# Patient Record
Sex: Female | Born: 1995
Health system: Southern US, Community
[De-identification: ages and names within clinical notes are randomized; demographics above are authoritative.]

## PROBLEM LIST (undated history)

## (undated) DIAGNOSIS — J399 Disease of upper respiratory tract, unspecified: Secondary | ICD-10-CM

## (undated) DIAGNOSIS — Z6833 Body mass index (BMI) 33.0-33.9, adult: Secondary | ICD-10-CM

## (undated) HISTORY — PX: NO PAST SURGERIES: SHX2092

## (undated) HISTORY — DX: Body mass index (BMI) 33.0-33.9, adult: Z68.33

## (undated) HISTORY — PX: OTHER SURGICAL HISTORY: SHX169

## (undated) HISTORY — DX: Disease of upper respiratory tract, unspecified: J39.9

---
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
## 2016-02-26 ENCOUNTER — Encounter (HOSPITAL_COMMUNITY): Payer: Self-pay | Admitting: Emergency Medicine

## 2016-02-26 DIAGNOSIS — R55 Syncope and collapse: Secondary | ICD-10-CM | POA: Insufficient documentation

## 2016-02-26 LAB — CBG MONITORING, ED: GLUCOSE-CAPILLARY: 118 mg/dL — AB (ref 65–99)

## 2016-02-26 NOTE — ED Notes (Signed)
Pt had a syncopal episode and "blacked out" for 2-3 minutes after swimming 1 hour ago.  States she thinks she swallowed too much water while swimming.  Reports ears felt full and she felt dizziness and nausea.  Pt denies any complaints at present.

## 2016-02-27 ENCOUNTER — Encounter (HOSPITAL_COMMUNITY): Payer: Self-pay

## 2016-02-27 ENCOUNTER — Emergency Department (HOSPITAL_COMMUNITY)
Admission: EM | Admit: 2016-02-27 | Discharge: 2016-02-27 | Disposition: A | Discharge status: Home / Self Care | Payer: BLUE CROSS/BLUE SHIELD | Attending: Emergency Medicine | Admitting: Emergency Medicine

## 2016-02-27 ENCOUNTER — Emergency Department (HOSPITAL_COMMUNITY): Payer: BLUE CROSS/BLUE SHIELD

## 2016-02-27 DIAGNOSIS — R55 Syncope and collapse: Secondary | ICD-10-CM | POA: Diagnosis not present

## 2016-02-27 LAB — CBC
HCT: 35 % — ABNORMAL LOW (ref 36.0–46.0)
Hemoglobin: 12 g/dL (ref 12.0–15.0)
MCH: 28.7 pg (ref 26.0–34.0)
MCHC: 34.3 g/dL (ref 30.0–36.0)
MCV: 83.7 fL (ref 78.0–100.0)
Platelets: 325 10*3/uL (ref 150–400)
RBC: 4.18 MIL/uL (ref 3.87–5.11)
RDW: 13.6 % (ref 11.5–15.5)
WBC: 7.5 10*3/uL (ref 4.0–10.5)

## 2016-02-27 LAB — BASIC METABOLIC PANEL WITH GFR
Anion gap: 9 (ref 5–15)
BUN: 9 mg/dL (ref 6–20)
CO2: 22 mmol/L (ref 22–32)
Calcium: 9.2 mg/dL (ref 8.9–10.3)
Chloride: 107 mmol/L (ref 101–111)
Creatinine, Ser: 0.86 mg/dL (ref 0.44–1.00)
GFR calc Af Amer: >60 mL/min
GFR calc non Af Amer: >60 mL/min
Glucose, Bld: 117 mg/dL — ABNORMAL HIGH (ref 65–99)
Potassium: 3.1 mmol/L — ABNORMAL LOW (ref 3.5–5.1)
Sodium: 138 mmol/L (ref 135–145)

## 2016-02-27 LAB — D-DIMER, QUANTITATIVE: D-Dimer, Quant: 0.54 ug/mL-FEU — ABNORMAL HIGH (ref 0.00–0.50)

## 2016-02-27 LAB — URINALYSIS, ROUTINE W REFLEX MICROSCOPIC
Bilirubin Urine: NEGATIVE
Glucose, UA: NEGATIVE mg/dL
Hgb urine dipstick: NEGATIVE
Ketones, ur: NEGATIVE mg/dL
Leukocytes, UA: NEGATIVE
Nitrite: NEGATIVE
Protein, ur: NEGATIVE mg/dL
Specific Gravity, Urine: 1.013 (ref 1.005–1.030)
pH: 6.5 (ref 5.0–8.0)

## 2016-02-27 LAB — I-STAT BETA HCG BLOOD, ED (MC, WL, AP ONLY): I-stat hCG, quantitative: <5 m[IU]/mL

## 2016-02-27 MED ORDER — IOPAMIDOL (ISOVUE-370) INJECTION 76%
INTRAVENOUS | Status: AC
  Administered 2016-02-27: 100 mL
  Filled 2016-02-27: qty 100

## 2016-02-27 MED ORDER — POTASSIUM CHLORIDE CRYS ER 20 MEQ PO TBCR
40.0000 meq | EXTENDED_RELEASE_TABLET | Freq: Once | ORAL | Status: AC
  Administered 2016-02-27: 40 meq via ORAL
  Filled 2016-02-27: qty 2

## 2016-02-27 MED ORDER — SODIUM CHLORIDE 0.9 % IV BOLUS (SEPSIS)
1000.0000 mL | Freq: Once | INTRAVENOUS | Status: AC
  Administered 2016-02-27: 1000 mL via INTRAVENOUS

## 2016-02-27 NOTE — Discharge Instructions (Signed)
Syncope Lindsay Barnett, your CT scan did not show any blood clots.  Your tests today were normal. Please see a primary care doctor within 3 days for close follow up.  If any symptoms worsen, come back to the ED immediately. Thank you. Syncope means a person passes out (faints). The person usually wakes up in less than 5 minutes. It is important to seek medical care for syncope. HOME CARE  Have someone stay with you until you feel normal.  Do not drive, use machines, or play sports until your doctor says it is okay.  Keep all doctor visits as told.  Lie down when you feel like you might pass out. Take deep breaths. Wait until you feel normal before standing up.  Drink enough fluids to keep your pee (urine) clear or pale yellow.  If you take blood pressure or heart medicine, get up slowly. Take several minutes to sit and then stand. GET HELP RIGHT AWAY IF:   You have a severe headache.  You have pain in the chest, belly (abdomen), or back.  You are bleeding from the mouth or butt (rectum).  You have black or tarry poop (stool).  You have an irregular or very fast heartbeat.  You have pain with breathing.  You keep passing out, or you have shaking (seizures) when you pass out.  You pass out when sitting or lying down.  You feel confused.  You have trouble walking.  You have severe weakness.  You have vision problems. If you fainted, call for help (911 in [place] Do not drive yourself to the hospital.   This information is not intended to replace advice given to you by your health care provider. Make sure you discuss any questions you have with your health care provider.   Document Released: 01/16/2008 Document Revised: 12/14/2014 Document Reviewed: 09/28/2011 Elsevier Interactive Patient Education [employer]2016 Elsevier Inc.

## 2016-02-27 NOTE — ED Provider Notes (Signed)
CSN: [id number]     Arrival date & time 02/26/16  2256 History   First MD Initiated Contact with Patient 02/27/16 [phone]     Chief Complaint  Patient presents with  . Loss of Consciousness     (Consider location/radiation/quality/duration/timing/severity/associated sxs/prior Treatment) HPI  Lindsay Barnett is a 20 y.o. female with no significant past medical history presenting today for syncope. Patient states she has sequela present for 2-3 minutes after swallowing. She thinks she may swallow too much water while swallowing. She states this only happens when she sticks her head under the water and swims. This happened to her once before in the past as well. She states it is helpful and she felt dizziness with nausea. Currently she is completely asymptomatic. Her only new medication is an estrogen-based birth control. She denies any history of blood clots, or lower history swelling. She has no chest pain or shortness of breath. There are no further complaints.  10 Systems reviewed and are negative for acute change except as noted in the HPI.     Past Medical History  Diagnosis Date  . Upper respiratory disease    History reviewed. No pertinent past surgical history. Family History  Problem Relation Age of Onset  . Lung cancer Maternal Grandfather   . Hypertension Mother    Social History  Substance Use Topics  . Smoking status: Never Smoker   . Smokeless tobacco: None  . Alcohol Use: No   OB History    No data available     Review of Systems    Allergies  Review of patient's allergies indicates no known allergies.  Home Medications   Prior to Admission medications   Medication Sig Start Date End Date Taking? Authorizing Provider  norgestimate-ethinyl estradiol (ORTHO-CYCLEN,SPRINTEC,PREVIFEM) 0.25-35 MG-MCG tablet Take 1 tablet by mouth daily. 09/22/15   Doreene Nest, NP   BP 105/49 mmHg  Pulse 73  Temp(Src) 97.8 F (36.6 C) (Oral)  Resp 18  Ht  (1.651  m)  Wt 197 lb (89.359 kg)  BMI 32.78 kg/m2  SpO2 100%  LMP 02/05/2016 Physical Exam  Constitutional: She is oriented to person, place, and time. She appears well-developed and well-nourished. No distress.  HENT:  Head: Normocephalic and atraumatic.  Nose: Nose normal.  Mouth/Throat: Oropharynx is clear and moist. No oropharyngeal exudate.  Eyes: Conjunctivae and EOM are normal. Pupils are equal, round, and reactive to light. No scleral icterus.  Neck: Normal range of motion. Neck supple. No JVD present. No tracheal deviation present. No thyromegaly present.  Cardiovascular: Normal rate, regular rhythm and normal heart sounds.  Exam reveals no gallop and no friction rub.   No murmur heard. Pulmonary/Chest: Effort normal and breath sounds normal. No respiratory distress. She has no wheezes. She exhibits no tenderness.  Abdominal: Soft. Bowel sounds are normal. She exhibits no distension and no mass. There is no tenderness. There is no rebound and no guarding.  Musculoskeletal: Normal range of motion. She exhibits no edema or tenderness.  Lymphadenopathy:    She has no cervical adenopathy.  Neurological: She is alert and oriented to person, place, and time. No cranial nerve deficit. She exhibits normal muscle tone.  Skin: Skin is warm and dry. No rash noted. No erythema. No pallor.  Nursing note and vitals reviewed.   ED Course  Procedures (including critical care time) Labs Review Labs Reviewed  BASIC METABOLIC PANEL - Abnormal; Notable for the following:    Potassium 3.1 (*)  Glucose, Bld 117 (*)    All other components within normal limits  CBC - Abnormal; Notable for the following:    HCT 35.0 (*)    All other components within normal limits  D-DIMER, QUANTITATIVE (NOT AT [facility]) - Abnormal; Notable for the following:    D-Dimer, Quant 0.54 (*)    All other components within normal limits  CBG MONITORING, ED - Abnormal; Notable for the following:    Glucose-Capillary 118 (*)     All other components within normal limits  URINALYSIS, ROUTINE W REFLEX MICROSCOPIC (NOT AT [facility])  I-STAT BETA HCG BLOOD, ED ([facility], [facility], [facility] ONLY)    Imaging Review Ct Angio Chest Pe W Or Wo Contrast  02/27/2016  CLINICAL DATA:  Syncope EXAM: CT ANGIOGRAPHY CHEST WITH CONTRAST TECHNIQUE: Multidetector CT imaging of the chest was performed using the standard protocol during bolus administration of intravenous contrast. Multiplanar CT image reconstructions and MIPs were obtained to evaluate the vascular anatomy. CONTRAST:  100 cc Isovue 370 COMPARISON:  None. FINDINGS: There are no filling defects in the pulmonary arterial tree to suggest acute pulmonary thromboembolism Visualized thyroid is unremarkable Residual thymus.  No abnormal mediastinal adenopathy No pneumothorax.  No pleural effusion. Lungs are under aerated and clear. No acute bony deformity. Review of the MIP images confirms the above findings. IMPRESSION: No evidence of acute pulmonary thromboembolism. Electronically Signed   By: Jolaine ClickArthur  Hoss M.D.   On: 02/27/2016 07:43   I have personally reviewed and evaluated these images and lab results as part of my medical decision-making.   EKG Interpretation   Date/Time:  Sunday February 26 2016 23:31:08 EDT Ventricular Rate:  50 PR Interval:  134 QRS Duration: 82 QT Interval:  438 QTC Calculation: 399 R Axis:   42 Text Interpretation:  Sinus bradycardia Nonspecific ST abnormality  Abnormal ECG No old tracing to compare Confirmed by Erroll Lunani, Jenner Rosier Ayokunle  [phone]([id number]) on 02/27/2016 4:53:29 AM      MDM   Final diagnoses:  None    Patient presents emergency department for syncope. EKG does not show any cause for this. Will obtain pregnancy test as well. D-dimer was sent due to risk factor of birth control. Laboratory studies reveal potassium 3.1 which was replaced. Patient was given IV fluids as well. She currently appears well in no acute distress, currently awaiting dimer results.  5:45 AM  Dimer is positive.  Will obtain CT scan for further evaluation.  CT neg for PE.  Advised to fu c PCP for further syncope work up.  Patient and famil demonstrate good understanding.  She appears well and in NAD.  Avoid swimming fir now.  VS remain within her normal limits and she is safe for DC  Tomasita CrumbleAdeleke Macen Joslin, MD 02/27/16 1349

## 2016-02-27 NOTE — ED Notes (Signed)
Pt to CT

## 2016-02-28 ENCOUNTER — Ambulatory Visit (INDEPENDENT_AMBULATORY_CARE_PROVIDER_SITE_OTHER): Payer: BLUE CROSS/BLUE SHIELD | Admitting: Primary Care

## 2016-02-28 VITALS — BP 114/66 | HR 72 | Temp 98.1°F | Ht 66.0 in | Wt 201.8 lb

## 2016-02-28 DIAGNOSIS — R55 Syncope and collapse: Secondary | ICD-10-CM | POA: Diagnosis not present

## 2016-02-28 DIAGNOSIS — R739 Hyperglycemia, unspecified: Secondary | ICD-10-CM

## 2016-02-28 NOTE — Patient Instructions (Signed)
Complete lab work prior to leaving today. I will notify you of your results once received.   Wear ear plugs when submerging your head underwater.  Meclizine is a medication over the counter used for treatment of Vertigo. You may try this if you experience dizziness/vertigo symptoms in the future.   Please notify me if your symptoms reoccur.  Ensure you are consuming 64 ounces of water daily.  It was a pleasure to see you today!

## 2016-02-28 NOTE — Assessment & Plan Note (Addendum)
Occurred 2 days ago while getting dressed after being in the pool for several hours. No proper hydration. Similar symptoms without syncope occurred 2 months ago after swimming in the pool.  Will have her use earplugs when submerging underwater in the pool, meclizine as needed if dizziness returns. Strict return precautions provided. Patient and family verbalized understanding.  Workup in the ED overall unremarkable including EKG and CT angio which is reassuring. Family history of narcolepsy and her brother.  Given symptoms have been associated just after swimming, suspect syncope and symptoms could be associated with inner ear disorder. Patient experiences changes in pressure once a merged underwater and could also be allowing fluid into her inner ear. This would cause symptoms of dizziness, nausea.  Will recheck BMP to ensure potassium has stabilized. We'll also check blood sugars and TSH to ensure no other abnormality.  May consider neurology consult for further evaluation. All Hospital notes, imaging, labs reviewed.

## 2016-02-28 NOTE — Progress Notes (Signed)
Subjective:    Patient ID: Lindsay Barnett, female    DOB: [birth year], 20 y.o.   MRN: [id number]  HPI  Ms. Lindsay Barnett is a 20 year old female who presents today for emergency department follow up.  She presented to [facility] on 07/17 with a chief complaint of syncope. She was swimming in the pool for numerous hours, without proper hydration. She then stepped out of the pool and started getting dressed when she noticed dizziness and nausea. She experienced a syncope episode that lasted 2-3 minutes. Her family members caught her and layed her on the floor.    She had a prior episode of dizziness, nausea, and vomiting after swimming with her head submerged under water about 2 months ago. She did not experience syncope  during that episode.  She has experienced 2 episodes of flushing and sudden onset of dizziness starting in 5th grade and again in 9th grade which were unrelated to swimming.  She was asymptomatic in the ED. She underwent evaluation with D-Dimer, BMP, UA, and ECG. All labs unremarkable except for positive D-dimer and potassium level of 3.1. She was treated with IV fluids, oral potassium, and underwent CT angio to rule out PE. Her CT was negative for PE. She was then discharged home with recommendations for PCP follow up.  Since her discharge home she denies dizziness, syncope, nausea. She also denies seasonal allergies, ear pressure/pain, congestion.  Review of Systems  Constitutional: Negative for fatigue.  HENT: Negative for congestion and ear pain.   Respiratory: Negative for shortness of breath.   Cardiovascular: Negative for chest pain.  Gastrointestinal: Negative for nausea.  Endocrine: Negative for cold intolerance.  Neurological: Negative for dizziness, seizures, numbness and headaches.       Past Medical History  Diagnosis Date  . Upper respiratory disease      Social History   Social History  . Marital Status: Single    Spouse Name: N/A  . Number of Children:  N/A  . Years of Education: N/A   Occupational History  . Not on file.   Social History Main Topics  . Smoking status: Never Smoker   . Smokeless tobacco: Not on file  . Alcohol Use: No  . Drug Use: No  . Sexual Activity: Not on file   Other Topics Concern  . Not on file   Social History Narrative   Single.   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   Enjoys playing on the computer, cooking.     No past surgical history on file.  Family History  Problem Relation Age of Onset  . Lung cancer Maternal Grandfather   . Hypertension Mother     No Known Allergies  Current Outpatient Prescriptions on File Prior to Visit  Medication Sig Dispense Refill  . norgestimate-ethinyl estradiol (ORTHO-CYCLEN,SPRINTEC,PREVIFEM) 0.25-35 MG-MCG tablet Take 1 tablet by mouth daily. 3 Package 3   No current facility-administered medications on file prior to visit.    BP 114/66 mmHg  Pulse 72  Temp(Src) 98.1 F (36.7 C) (Oral)  Ht  (1.676 m)  Wt 201 lb 12.8 oz (91.536 kg)  BMI 32.59 kg/m2  SpO2 98%  LMP 02/05/2016 (Within Days)    Objective:   Physical Exam  Constitutional: She is oriented to person, place, and time. She appears well-nourished.  Eyes: EOM are normal. Pupils are equal, round, and reactive to light.  Neck: Neck supple. No thyromegaly present.  Cardiovascular: Normal rate and regular rhythm.  Pulmonary/Chest: Effort normal and breath sounds normal.  Neurological: She is alert and oriented to person, place, and time. No cranial nerve deficit.  Skin: Skin is warm and dry.          Assessment & Plan:

## 2016-02-28 NOTE — Progress Notes (Signed)
Pre visit review using our clinic review tool, if applicable. No additional management support is needed unless otherwise documented below in the visit note. 

## 2016-02-29 ENCOUNTER — Ambulatory Visit: Payer: BLUE CROSS/BLUE SHIELD | Admitting: Primary Care

## 2016-02-29 LAB — BASIC METABOLIC PANEL
BUN: 9 mg/dL (ref 6–23)
CALCIUM: 9 mg/dL (ref 8.4–10.5)
CO2: 25 meq/L (ref 19–32)
CREATININE: 0.86 mg/dL (ref 0.40–1.20)
Chloride: 106 mEq/L (ref 96–112)
GFR: 109.11 mL/min (ref >60.00)
GLUCOSE: 101 mg/dL — AB (ref 70–99)
Potassium: 4 mEq/L (ref 3.5–5.1)
Sodium: 138 mEq/L (ref 135–145)

## 2016-02-29 LAB — HEMOGLOBIN A1C: Hgb A1c MFr Bld: 4.8 % (ref 4.6–6.5)

## 2016-02-29 LAB — TSH: TSH: 0.74 u[IU]/mL (ref 0.40–5.00)

## 2016-08-17 ENCOUNTER — Other Ambulatory Visit: Payer: Self-pay | Admitting: Primary Care

## 2016-08-17 DIAGNOSIS — Z30011 Encounter for initial prescription of contraceptive pills: Secondary | ICD-10-CM

## 2016-10-22 ENCOUNTER — Ambulatory Visit (INDEPENDENT_AMBULATORY_CARE_PROVIDER_SITE_OTHER): Payer: BLUE CROSS/BLUE SHIELD | Admitting: Family Medicine

## 2016-10-22 ENCOUNTER — Encounter: Payer: Self-pay | Admitting: Family Medicine

## 2016-10-22 VITALS — BP 110/78 | HR 110 | Temp 98.6°F | Wt 200.4 lb

## 2016-10-22 DIAGNOSIS — J029 Acute pharyngitis, unspecified: Secondary | ICD-10-CM

## 2016-10-22 DIAGNOSIS — J069 Acute upper respiratory infection, unspecified: Secondary | ICD-10-CM

## 2016-10-22 DIAGNOSIS — B9789 Other viral agents as the cause of diseases classified elsewhere: Secondary | ICD-10-CM | POA: Diagnosis not present

## 2016-10-22 NOTE — Progress Notes (Signed)
Pre visit review using our clinic review tool, if applicable. No additional management support is needed unless otherwise documented below in the visit note. 

## 2016-10-22 NOTE — Patient Instructions (Signed)
For nasal congestion you can use Afrin nasal spray for 3 days max, saline nasal spray (generic is fine for all). For cough you can try Delsym. Drink enough fluids to make your urine light yellow. For fever/chill/muscle aches you can take over the counter acetaminophen or ibuprofen- 2 tablets every 8-12 hours.  Please come back in if you are not better in 5-7 days or if you develop wheezing, shortness of breath or persistent vomiting. Your strep test was negative.    Pharyngitis Pharyngitis is redness, pain, and swelling (inflammation) of your pharynx. What are the causes? Pharyngitis is usually caused by infection. Most of the time, these infections are from viruses (viral) and are part of a cold. However, sometimes pharyngitis is caused by bacteria (bacterial). Pharyngitis can also be caused by allergies. Viral pharyngitis may be spread from person to person by coughing, sneezing, and personal items or utensils (cups, forks, spoons, toothbrushes). Bacterial pharyngitis may be spread from person to person by more intimate contact, such as kissing. What are the signs or symptoms? Symptoms of pharyngitis include:  Sore throat.  Tiredness (fatigue).  Low-grade fever.  Headache.  Joint pain and muscle aches.  Skin rashes.  Swollen lymph nodes.  Plaque-like film on throat or tonsils (often seen with bacterial pharyngitis). How is this diagnosed? Your health care provider will ask you questions about your illness and your symptoms. Your medical history, along with a physical exam, is often all that is needed to diagnose pharyngitis. Sometimes, a rapid strep test is done. Other lab tests may also be done, depending on the suspected cause. How is this treated? Viral pharyngitis will usually get better in 3-4 days without the use of medicine. Bacterial pharyngitis is treated with medicines that kill germs (antibiotics). Follow these instructions at home:  Drink enough water and fluids to  keep your urine clear or pale yellow.  Only take over-the-counter or prescription medicines as directed by your health care provider:  If you are prescribed antibiotics, make sure you finish them even if you start to feel better.  Do not take aspirin.  Get lots of rest.  Gargle with 8 oz of salt water ( tsp of salt per 1 qt of water) as often as every 1-2 hours to soothe your throat.  Throat lozenges (if you are not at risk for choking) or sprays may be used to soothe your throat. Contact a health care provider if:  You have large, tender lumps in your neck.  You have a rash.  You cough up green, yellow-brown, or bloody spit. Get help right away if:  Your neck becomes stiff.  You drool or are unable to swallow liquids.  You vomit or are unable to keep medicines or liquids down.  You have severe pain that does not go away with the use of recommended medicines.  You have trouble breathing (not caused by a stuffy nose). This information is not intended to replace advice given to you by your health care provider. Make sure you discuss any questions you have with your health care provider. Document Released: 07/30/2005 Document Revised: 01/05/2016 Document Reviewed: 04/06/2013 Elsevier Interactive Patient Education  2017 [employer]

## 2016-10-22 NOTE — Progress Notes (Signed)
   Subjective:    Patient ID: Lindsay Barnett, female    DOB: 08/16/1995, 21 y.o.   MRN: [id number]  HPI This is a 21 yo female who presents today with 6 days of sore throat, pain with swallowing. No cough, no SOB, no wheeze. Has been taking Allegra D, Nyquil, tylenol, apple cider vinegar, honey, hot tea. Some sinus pressure. Small amount clear nasal drainage. Right ear pain when using ear buds. Has been checking temperature, no fever. No generalized aching. Mother and sister with similar symptoms.    Past Medical History:  Diagnosis Date  . Upper respiratory disease    No past surgical history on file. Family History  Problem Relation Age of Onset  . Lung cancer Maternal Grandfather   . Hypertension Mother    Social History  Substance Use Topics  . Smoking status: Never Smoker  . Smokeless tobacco: Never Used  . Alcohol use No      Review of Systems Per HPI    Objective:   Physical Exam  Constitutional: She appears well-developed and well-nourished. No distress.  HENT:  Head: Normocephalic and atraumatic.  Right Ear: Tympanic membrane, external ear and ear canal normal.  Left Ear: Tympanic membrane, external ear and ear canal normal.  Nose: Nose normal. Right sinus exhibits no maxillary sinus tenderness and no frontal sinus tenderness. Left sinus exhibits no maxillary sinus tenderness and no frontal sinus tenderness.  Mouth/Throat: Uvula is midline and mucous membranes are normal. Posterior oropharyngeal erythema present. No oropharyngeal exudate or posterior oropharyngeal edema.  Skin: She is not diaphoretic.  Vitals reviewed.     BP 110/78 (BP Location: Left Arm, Patient Position: Sitting, Cuff Size: Normal)   Pulse (!) 110   Temp 98.6 F (37 C) (Oral)   Wt 200 lb 6.4 oz (90.9 kg)   LMP 10/17/2016   SpO2 97%   BMI 32.35 kg/m  Wt Readings from Last 3 Encounters:  10/22/16 200 lb 6.4 oz (90.9 kg) (97 %, Z= 1.95)*  02/28/16 201 lb 12.8 oz (91.5 kg) (98 %, Z=  1.98)*  02/26/16 197 lb (89.4 kg) (97 %, Z= 1.91)*   * Growth percentiles are based on CDC 2-20 Years data.      Results for orders placed or performed in visit on 10/22/16  POCT rapid strep A  Result Value Ref Range   Rapid Strep A Screen Negative Negative    Assessment & Plan:  1. Sore throat - POCT rapid strep A- negative - symptoms likely due to #2  2. Viral URI - continue symptomatic treatment, should be feeling better in a couple of days - patient instructions: For nasal congestion you can use Afrin nasal spray for 3 days max, saline nasal spray (generic is fine for all). For cough you can try Delsym. Drink enough fluids to make your urine light yellow. For fever/chill/muscle aches you can take over the counter acetaminophen or ibuprofen- 2 tablets every 8-12 hours.  Please come back in if you are not better in 5-7 days or if you develop wheezing, shortness of breath or persistent vomiting.   Olean Reeeborah Gessner, FNP-BC  [facility] [place], [facility]  10/24/2016 3:22 PM

## 2016-10-24 ENCOUNTER — Encounter: Payer: Self-pay | Admitting: Family Medicine

## 2016-10-24 ENCOUNTER — Ambulatory Visit (INDEPENDENT_AMBULATORY_CARE_PROVIDER_SITE_OTHER): Payer: BLUE CROSS/BLUE SHIELD | Admitting: Family Medicine

## 2016-10-24 VITALS — BP 118/76 | HR 96 | Temp 98.8°F | Wt 197.0 lb

## 2016-10-24 DIAGNOSIS — J0110 Acute frontal sinusitis, unspecified: Secondary | ICD-10-CM

## 2016-10-24 LAB — POCT RAPID STREP A (OFFICE): Rapid Strep A Screen: NEGATIVE

## 2016-10-24 MED ORDER — AMOXICILLIN-POT CLAVULANATE 875-125 MG PO TABS: 1.0000 | ORAL_TABLET | Freq: Two times a day (BID) | ORAL | 0 refills | Status: AC

## 2016-10-24 NOTE — Progress Notes (Signed)
Pre visit review using our clinic review tool, if applicable. No additional management support is needed unless otherwise documented below in the visit note. 

## 2016-10-24 NOTE — Progress Notes (Signed)
SUBJECTIVE:  Lindsay Barnett is a 21 y.o. female who complains of coryza, congestion, sore throat and bilateral sinus pain for 10 days.  She has been taking Allegra D, mucinex and nyquil with only mild improvement of symptoms. She denies a history of anorexia and chest pain and denies a history of asthma. Patient denies smoke cigarettes.   Current Outpatient Prescriptions on File Prior to Visit  Medication Sig Dispense Refill  . SPRINTEC 28 0.25-35 MG-MCG tablet TAKE 1 TABLET BY MOUTH DAILY. 84 tablet 1   No current facility-administered medications on file prior to visit.     No Known Allergies  Past Medical History:  Diagnosis Date  . Upper respiratory disease     No past surgical history on file.  Family History  Problem Relation Age of Onset  . Lung cancer Maternal Grandfather   . Hypertension Mother     Social History   Social History  . Marital status: Single    Spouse name: N/A  . Number of children: N/A  . Years of education: N/A   Occupational History  . Not on file.   Social History Main Topics  . Smoking status: Never Smoker  . Smokeless tobacco: Never Used  . Alcohol use No  . Drug use: No  . Sexual activity: Not on file   Other Topics Concern  . Not on file   Social History Narrative   Single.   Student [facility] [employer]   Studying psychology.   Enjoys playing on the computer, cooking.    The PMH, PSH, Social History, Family History, Medications, and allergies have been reviewed in [facility], and have been updated if relevant.  OBJECTIVE: BP 118/76 (BP Location: Left Arm, Patient Position: Sitting, Cuff Size: Normal)   Pulse 96   Temp 98.8 F (37.1 C) (Oral)   Wt 197 lb (89.4 kg)   LMP 10/17/2016   SpO2 98%   BMI 31.80 kg/m   She appears well, vital signs are as noted. Ears normal.  Throat and pharynx normal.  Neck supple. No adenopathy in the neck. Nose is congested. Sinuses tender. The chest is clear, without wheezes or  rales.  ASSESSMENT:  sinusitis  PLAN: Given duration and progression of symptoms, will treat for bacterial sinusitis with Augmentin.   Symptomatic therapy suggested: push fluids, rest and return office visit prn if symptoms persist or worsen.Call or return to clinic prn if these symptoms worsen or fail to improve as anticipated.

## 2016-10-24 NOTE — Patient Instructions (Signed)
Take antibiotic as directed.  Drink lots of fluids.    Treat sympotmatically with Mucinex, nasal saline irrigation, and Tylenol/Ibuprofen.   Also try an antihistamine/decongestant like claritin D or zyrtec D over the counter- two times a day as needed ( have to sign for them at pharmacy).   Try over the counter nasocort-start with 2 sprays per nostril per day...and then try to taper to 1 spray per nostril once symptoms improve.   You can use warm compresses.    Call if not improving as expected in 5-7 days.    

## 2017-02-01 ENCOUNTER — Other Ambulatory Visit: Payer: Self-pay | Admitting: Primary Care

## 2017-02-01 DIAGNOSIS — Z30011 Encounter for initial prescription of contraceptive pills: Secondary | ICD-10-CM

## 2017-07-23 ENCOUNTER — Other Ambulatory Visit: Payer: Self-pay | Admitting: Primary Care

## 2017-07-23 DIAGNOSIS — Z30011 Encounter for initial prescription of contraceptive pills: Secondary | ICD-10-CM

## 2017-10-15 ENCOUNTER — Ambulatory Visit: Payer: BLUE CROSS/BLUE SHIELD | Admitting: Primary Care

## 2017-10-15 ENCOUNTER — Encounter: Payer: Self-pay | Admitting: Primary Care

## 2017-10-15 DIAGNOSIS — Z30011 Encounter for initial prescription of contraceptive pills: Secondary | ICD-10-CM | POA: Diagnosis not present

## 2017-10-15 DIAGNOSIS — Z3041 Encounter for surveillance of contraceptive pills: Secondary | ICD-10-CM

## 2017-10-15 MED ORDER — NORGESTIMATE-ETH ESTRADIOL 0.25-35 MG-MCG PO TABS: 1.0000 | ORAL_TABLET | Freq: Every day | ORAL | 3 refills | Status: DC

## 2017-10-15 NOTE — Patient Instructions (Signed)
Think about the HPV vaccinations, this will be a series of three vaccinations to help prevent cervical cancer caused by HPV.   You are due for your first Pap smear this year, please schedule to have this done after your 22st birthday.  It was a pleasure to see you today!

## 2017-10-15 NOTE — Progress Notes (Signed)
   Subjective:    Patient ID: Lindsay Barnett, female    DOB: 1996-07-06, 22 y.o.   MRN: [id number]  HPI  Ms. Lindsay Barnett is a 22 year old female who presents today for medication refill.   She is currently managed on Sprintec tablets. She denies missing any pills. She is sexually active with a long term partner and is not using protection. She doesn't recall ever receiving the HPV vaccinations.   She denies vaginal itching, vaginal discharge, heavy vaginal bleeding. Her menstrual cycles are regular, once monthly lasting 5-7 days. She does still experience cramping for the first 1-3 days with improvement after taking Midol PRN.   She's never had a pap smear, will be 22 in May 2019. LMP was February 6th.   Review of Systems  Genitourinary: Negative for dysuria, genital sores, menstrual problem, pelvic pain and vaginal discharge.       Past Medical History:  Diagnosis Date  . Upper respiratory disease      Social History   Socioeconomic History  . Marital status: Single    Spouse name: Not on file  . Number of children: Not on file  . Years of education: Not on file  . Highest education level: Not on file  Social Needs  . Financial resource strain: Not on file  . Food insecurity - worry: Not on file  . Food insecurity - inability: Not on file  . Transportation needs - medical: Not on file  . Transportation needs - non-medical: Not on file  Occupational History  . Not on file  Tobacco Use  . Smoking status: Never Smoker  . Smokeless tobacco: Never Used  Substance and Sexual Activity  . Alcohol use: No    Alcohol/week: 0.0 oz  . Drug use: No  . Sexual activity: Not on file  Other Topics Concern  . Not on file  Social History Narrative   Single.   Student [employer]   Studying psychology.   Enjoys playing on the computer, cooking.     No past surgical history on file.  Family History  Problem Relation Age of Onset  . Lung cancer Maternal Grandfather     . Hypertension Mother     No Known Allergies  Current Outpatient Medications on File Prior to Visit  Medication Sig Dispense Refill  . norgestimate-ethinyl estradiol (SPRINTEC 28) 0.25-35 MG-MCG tablet Take 1 tablet by mouth daily. NEED OFFICE APPOINTMENT FOR ANY MORE REFILLS 84 tablet 0   No current facility-administered medications on file prior to visit.     BP 114/74   Pulse 71   Temp 98.2 F (36.8 C) (Oral)   Ht 5' 5.5" (1.664 m)   Wt 217 lb (98.4 kg)   LMP 09/15/2017   SpO2 99%   BMI 35.56 kg/m    Objective:   Physical Exam  Constitutional: She appears well-nourished.  Neck: Neck supple.  Cardiovascular: Normal rate and regular rhythm.  Pulmonary/Chest: Effort normal and breath sounds normal.  Skin: Skin is warm and dry.          Assessment & Plan:

## 2018-09-14 ENCOUNTER — Other Ambulatory Visit: Payer: Self-pay | Admitting: Primary Care

## 2018-09-14 DIAGNOSIS — Z30011 Encounter for initial prescription of contraceptive pills: Secondary | ICD-10-CM

## 2018-12-02 ENCOUNTER — Other Ambulatory Visit: Payer: Self-pay | Admitting: Primary Care

## 2018-12-02 DIAGNOSIS — Z30011 Encounter for initial prescription of contraceptive pills: Secondary | ICD-10-CM

## 2018-12-24 ENCOUNTER — Other Ambulatory Visit: Payer: Self-pay | Admitting: Primary Care

## 2018-12-24 DIAGNOSIS — Z30011 Encounter for initial prescription of contraceptive pills: Secondary | ICD-10-CM

## 2019-01-08 ENCOUNTER — Ambulatory Visit (INDEPENDENT_AMBULATORY_CARE_PROVIDER_SITE_OTHER): Payer: 59 | Admitting: Primary Care

## 2019-01-08 ENCOUNTER — Encounter: Payer: Self-pay | Admitting: Primary Care

## 2019-01-08 DIAGNOSIS — Z3041 Encounter for surveillance of contraceptive pills: Secondary | ICD-10-CM

## 2019-01-08 MED ORDER — NORETHINDRONE ACET-ETHINYL EST 1-20 MG-MCG PO TABS: 1.0000 | ORAL_TABLET | Freq: Every day | ORAL | 0 refills | Status: DC

## 2019-01-08 NOTE — Progress Notes (Signed)
Subjective:    Patient ID: Lindsay Barnett, female    DOB: Mar 31, 1996, 23 y.o.   MRN: [id number]  HPI  Virtual Visit via Video Note  I connected with Lindsay Barnett on 01/08/19 at  9:20 AM EDT by a video enabled telemedicine application and verified that I am speaking with the correct person using two identifiers.  Location: Patient: Home Provider: Office   I discussed the limitations of evaluation and management by telemedicine and the availability of in person appointments. The patient expressed understanding and agreed to proceed.  History of Present Illness:  Lindsay Barnett is a 23 year old female who presents today for medication refill.  She is currently managed on Sprintec daily for contraception prevention for which she's taken for years. She has never had a pap smear and does plan on scheduling this soon. She does continue with dysmenorrhea, Ibuprofen doesn't help. She denies menorrhagia, breakthrough vaginal bleeding, vaginal discharge. She has menstrual cycle once monthly which will last 4-5 days. LMP 01/08/19.   Observations/Objective:  Alert and oriented. Appears well, not sickly. No distress. Speaking in complete sentences.   Assessment and Plan:  Dysmenorrhea with regular cycle. No menorrhagia. Will trial adjusting the hormones in her OCP's to see if this helps. Stop Sprintec. Start Junel. I strongly advised her to schedule a pap smear with me within the next three months, she verbalized understanding.  She will update.   Follow Up Instructions:  Stop Sprintec 0.25-35 mg-mcg birth control.  Start Junel 1-20 mg-mcg tablets for birth control. Start this on Sunday this week as discussed.   Schedule a pap smear with me within the next three months as discussed.  It was a pleasure to see you today! Mayra Reel, NP-C    I discussed the assessment and treatment plan with the patient. The patient was provided an opportunity to ask questions and all were  answered. The patient agreed with the plan and demonstrated an understanding of the instructions.   The patient was advised to call back or seek an in-person evaluation if the symptoms worsen or if the condition fails to improve as anticipated.    Doreene Nest, NP    Review of Systems  Eyes: Negative for visual disturbance.  Respiratory: Negative for shortness of breath.   Cardiovascular: Negative for chest pain.  Genitourinary: Positive for menstrual problem. Negative for dysuria, frequency and vaginal discharge.       Dysmenorrhea   Neurological: Negative for dizziness and headaches.       Past Medical History:  Diagnosis Date  . Upper respiratory disease      Social History   Socioeconomic History  . Marital status: Single    Spouse name: Not on file  . Number of children: Not on file  . Years of education: Not on file  . Highest education level: Not on file  Occupational History  . Not on file  Social Needs  . Financial resource strain: Not on file  . Food insecurity:    Worry: Not on file    Inability: Not on file  . Transportation needs:    Medical: Not on file    Non-medical: Not on file  Tobacco Use  . Smoking status: Never Smoker  . Smokeless tobacco: Never Used  Substance and Sexual Activity  . Alcohol use: No    Alcohol/week: 0.0 standard drinks  . Drug use: No  . Sexual activity: Not on file  Lifestyle  . Physical activity:  Days per week: Not on file    Minutes per session: Not on file  . Stress: Not on file  Relationships  . Social connections:    Talks on phone: Not on file    Gets together: Not on file    Attends religious service: Not on file    Active member of club or organization: Not on file    Attends meetings of clubs or organizations: Not on file    Relationship status: Not on file  . Intimate partner violence:    Fear of current or ex partner: Not on file    Emotionally abused: Not on file    Physically abused: Not on  file    Forced sexual activity: Not on file  Other Topics Concern  . Not on file  Social History Narrative   Single.   Student [employer]   Studying psychology.   Enjoys playing on the computer, cooking.     No past surgical history on file.  Family History  Problem Relation Age of Onset  . Lung cancer Maternal Grandfather   . Hypertension Mother     No Known Allergies  No current outpatient medications on file prior to visit.   No current facility-administered medications on file prior to visit.     LMP 01/08/2019    Objective:   Physical Exam  Constitutional: She is oriented to person, place, and time. She appears well-nourished.  Respiratory: Effort normal.  Neurological: She is alert and oriented to person, place, and time.  Psychiatric: She has a normal mood and affect.           Assessment & Plan:

## 2019-01-08 NOTE — Patient Instructions (Signed)
Stop Sprintec 0.25-35 mg-mcg birth control.  Start Junel 1-20 mg-mcg tablets for birth control. Start this on Sunday this week as discussed.   Schedule a pap smear with me within the next three months as discussed.  It was a pleasure to see you today! Mayra Reel, NP-C

## 2019-01-08 NOTE — Assessment & Plan Note (Signed)
Dysmenorrhea with regular cycle. No menorrhagia. Will trial adjusting the hormones in her OCP's to see if this helps. Stop Sprintec. Start Junel. I strongly advised her to schedule a pap smear with me within the next three months, she verbalized understanding.  She will update.

## 2019-02-27 ENCOUNTER — Ambulatory Visit: Payer: 59 | Admitting: Primary Care

## 2019-02-27 ENCOUNTER — Encounter: Payer: Self-pay | Admitting: Primary Care

## 2019-02-27 ENCOUNTER — Other Ambulatory Visit: Payer: Self-pay

## 2019-02-27 VITALS — BP 124/82 | HR 88 | Temp 98.4°F | Ht 65.5 in | Wt 216.8 lb

## 2019-02-27 DIAGNOSIS — R2232 Localized swelling, mass and lump, left upper limb: Secondary | ICD-10-CM | POA: Diagnosis not present

## 2019-02-27 NOTE — Patient Instructions (Signed)
You will be contacted regarding your ultrasound of the breast and underarm. Please let us know if you have not been contacted within one week.   Complete monthly self breast exams as discussed.   It was a pleasure to see you today!

## 2019-02-27 NOTE — Assessment & Plan Note (Signed)
Suspect lipoma. Breast exam today with several left breast densities.  Given mothers history of breast cancer, we will obtain left breast ultrasound with axilla evaluation.  Orders placed.

## 2019-02-27 NOTE — Progress Notes (Signed)
Subjective:    Patient ID: Lindsay Barnett, female    DOB: 1995/12/05, 23 y.o.   MRN: [id number]  HPI  Ms.Lindsay Barnett is a 23 year old female who presents today with a chief complaint of skin mass.  Her mass is located to the left axilla for which she first noticed over one year ago. The mass is not painful, no changes in size, no redness. Her mother was diagnosed with breast cancer over one year ago. She does breast exams but is not sure if she's doing them correctly. Denies breast masses/lumps.   Review of Systems  Constitutional: Negative for fever.  Skin: Negative for color change.       Left axillary skin mass       Past Medical History:  Diagnosis Date  . Upper respiratory disease      Social History   Socioeconomic History  . Marital status: Single    Spouse name: Not on file  . Number of children: Not on file  . Years of education: Not on file  . Highest education level: Not on file  Occupational History  . Not on file  Social Needs  . Financial resource strain: Not on file  . Food insecurity    Worry: Not on file    Inability: Not on file  . Transportation needs    Medical: Not on file    Non-medical: Not on file  Tobacco Use  . Smoking status: Never Smoker  . Smokeless tobacco: Never Used  Substance and Sexual Activity  . Alcohol use: No    Alcohol/week: 0.0 standard drinks  . Drug use: No  . Sexual activity: Not on file  Lifestyle  . Physical activity    Days per week: Not on file    Minutes per session: Not on file  . Stress: Not on file  Relationships  . Social Musicianconnections    Talks on phone: Not on file    Gets together: Not on file    Attends religious service: Not on file    Active member of club or organization: Not on file    Attends meetings of clubs or organizations: Not on file    Relationship status: Not on file  . Intimate partner violence    Fear of current or ex partner: Not on file    Emotionally abused: Not on file   Physically abused: Not on file    Forced sexual activity: Not on file  Other Topics Concern  . Not on file  Social History Narrative   Single.   Student [employer]   Studying psychology.   Enjoys playing on the computer, cooking.     History reviewed. No pertinent surgical history.  Family History  Problem Relation Age of Onset  . Lung cancer Maternal Grandfather   . Hypertension Mother   . Breast cancer Mother [id number]    No Known Allergies  Current Outpatient Medications on File Prior to Visit  Medication Sig Dispense Refill  . norethindrone-ethinyl estradiol (JUNEL 1/20) 1-20 MG-MCG tablet Take 1 tablet by mouth daily. 3 Package 0   No current facility-administered medications on file prior to visit.     BP 124/82   Pulse 88   Temp 98.4 F (36.9 C) (Temporal)   Ht 5' 5.5" (1.664 m)   Wt 216 lb 12 oz (98.3 kg)   LMP 02/25/2019   SpO2 99%   BMI 35.52 kg/m    Objective:   Physical Exam  Constitutional:  She appears well-nourished.  Neck: Neck supple.  Cardiovascular: Normal rate and regular rhythm.  Respiratory: Effort normal and breath sounds normal.    Several densities noted to the left breast, mostly noted to 5 and 7 o'clock regions.   Skin: Skin is warm and dry. No erythema.           Assessment & Plan:

## 2019-03-11 ENCOUNTER — Other Ambulatory Visit: Payer: Self-pay

## 2019-03-11 ENCOUNTER — Ambulatory Visit
Admission: RE | Admit: 2019-03-11 | Discharge: 2019-03-11 | Disposition: A | Discharge status: Home / Self Care | Payer: 59 | Source: Ambulatory Visit | Attending: Primary Care | Admitting: Primary Care

## 2019-03-11 DIAGNOSIS — R2232 Localized swelling, mass and lump, left upper limb: Secondary | ICD-10-CM

## 2019-03-26 ENCOUNTER — Telehealth: Payer: Self-pay | Admitting: *Deleted

## 2019-03-26 NOTE — Telephone Encounter (Signed)
Pt called triage asking to speak with Vallarie Mare, I did advised pt that Vallarie Mare is gone for today. Pt let me know that Vallarie Mare tried to help her with mychart and sent her a link. She said she had some questions about the link and wanted to talk to [place] about it. I did give her the mychart phone # and advised her that they should be able to answer any questions she had with getting set up with mychart but she still requested I send the message to Vallarie Mare and request her to call her back once she returns to the office    CB# [phone]

## 2019-03-29 ENCOUNTER — Other Ambulatory Visit: Payer: Self-pay | Admitting: Primary Care

## 2019-03-29 DIAGNOSIS — Z3041 Encounter for surveillance of contraceptive pills: Secondary | ICD-10-CM

## 2019-03-30 NOTE — Telephone Encounter (Signed)
Spoken to patient and resent the code for patient to activate MyChart.

## 2019-11-02 ENCOUNTER — Other Ambulatory Visit: Payer: Self-pay

## 2019-11-02 ENCOUNTER — Ambulatory Visit: Payer: 59 | Admitting: Primary Care

## 2019-11-02 ENCOUNTER — Other Ambulatory Visit (HOSPITAL_COMMUNITY)
Admission: RE | Admit: 2019-11-02 | Discharge: 2019-11-02 | Disposition: A | Discharge status: Home / Self Care | Payer: 59 | Source: Ambulatory Visit | Attending: Primary Care | Admitting: Primary Care

## 2019-11-02 ENCOUNTER — Encounter: Payer: Self-pay | Admitting: Primary Care

## 2019-11-02 VITALS — BP 118/72 | HR 102 | Temp 97.2°F | Wt 238.8 lb

## 2019-11-02 DIAGNOSIS — Z3041 Encounter for surveillance of contraceptive pills: Secondary | ICD-10-CM

## 2019-11-02 DIAGNOSIS — Z124 Encounter for screening for malignant neoplasm of cervix: Secondary | ICD-10-CM

## 2019-11-02 DIAGNOSIS — F329 Major depressive disorder, single episode, unspecified: Secondary | ICD-10-CM | POA: Diagnosis not present

## 2019-11-02 DIAGNOSIS — F32A Depression, unspecified: Secondary | ICD-10-CM

## 2019-11-02 MED ORDER — NORETHINDRONE ACET-ETHINYL EST 1-20 MG-MCG PO TABS: 1.0000 | ORAL_TABLET | Freq: Every day | ORAL | 3 refills | Status: DC

## 2019-11-02 NOTE — Progress Notes (Signed)
Subjective:    Patient ID: Lindsay Barnett, female    DOB: 07-09-1996, 24 y.o.   MRN: [id number]  HPI  This visit occurred during the SARS-CoV-2 public health emergency.  Safety protocols were in place, including screening questions prior to the visit, additional usage of staff PPE, and extensive cleaning of exam room while observing appropriate contact time as indicated for disinfecting solutions.   Lindsay Barnett is a 24 year old female who presents today for medication refills and pap smear.  Currently managed on Junel 1/20 tablets daily. Menstrual cycles occur once monthly lasting 4-5 days, light overall. She denies dysmenorrhea. She's never had a pap smear.   She denies vaginal discharge, dysuria. Sexually active, not using condoms for STD prevention.   She would also like a referral to speak with a therapist. Symptoms include little motivation to do things, feeling like she's letting herself down, fatigue, feeling down about her weight. She's been exercising 10 minutes daily over the last several weeks which has helped. PHQ 9 score of 15.   Review of Systems  Constitutional: Negative for fatigue.  Cardiovascular: Negative for palpitations.  Genitourinary: Negative for dysuria, frequency, menstrual problem, pelvic pain and vaginal discharge.       Past Medical History:  Diagnosis Date  . Upper respiratory disease      Social History   Socioeconomic History  . Marital status: Single    Spouse name: Not on file  . Number of children: Not on file  . Years of education: Not on file  . Highest education level: Not on file  Occupational History  . Not on file  Tobacco Use  . Smoking status: Never Smoker  . Smokeless tobacco: Never Used  Substance and Sexual Activity  . Alcohol use: No    Alcohol/week: 0.0 standard drinks  . Drug use: No  . Sexual activity: Not on file  Other Topics Concern  . Not on file  Social History Narrative   Single.   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   Enjoys playing on the computer, cooking.    Social Determinants of Health   Financial Resource Strain:   . Difficulty of Paying Living Expenses:   Food Insecurity:   . Worried About Programme researcher, broadcasting/film/video in the Last Year:   . Barista in the Last Year:   Transportation Needs:   . Freight forwarder (Medical):   Marland Kitchen Lack of Transportation (Non-Medical):   Physical Activity:   . Days of Exercise per Week:   . Minutes of Exercise per Session:   Stress:   . Feeling of Stress :   Social Connections:   . Frequency of Communication with Friends and Family:   . Frequency of Social Gatherings with Friends and Family:   . Attends Religious Services:   . Active Member of Clubs or Organizations:   . Attends Banker Meetings:   Marland Kitchen Marital Status:   Intimate Partner Violence:   . Fear of Current or Ex-Partner:   . Emotionally Abused:   Marland Kitchen Physically Abused:   . Sexually Abused:     No past surgical history on file.  Family History  Problem Relation Age of Onset  . Lung cancer Maternal Grandfather   . Hypertension Mother   . Breast cancer Mother 29  . Breast cancer Maternal Aunt        60's    No Known Allergies  Current Outpatient Medications on File  Prior to Visit  Medication Sig Dispense Refill  . JUNEL 1/20 1-20 MG-MCG tablet TAKE 1 TABLET BY MOUTH EVERY DAY 84 tablet 1   No current facility-administered medications on file prior to visit.    BP 118/72 (BP Location: Left Arm, Patient Position: Sitting, Cuff Size: Normal)   Pulse (!) 102   Temp (!) 97.2 F (36.2 C) (Temporal)   Wt 238 lb 12.8 oz (108.3 kg)   LMP 10/12/2019   SpO2 98%   BMI 39.13 kg/m    Objective:   Physical Exam  Constitutional: She appears well-nourished.  Cardiovascular: Normal rate and regular rhythm.  Respiratory: Effort normal and breath sounds normal.  Genitourinary: Cervix exhibits no motion tenderness and no discharge. Right adnexum  displays no tenderness. Left adnexum displays no tenderness.    No vaginal discharge or erythema.  No erythema in the vagina.  Musculoskeletal:     Cervical back: Neck supple.  Skin: Skin is warm and dry.  Psychiatric: She has a normal mood and affect.           Assessment & Plan:

## 2019-11-02 NOTE — Patient Instructions (Addendum)
We will be in touch with your pap results once received.  You will be contacted regarding your referral to therapy.  Please let us know if you have not been contacted within two weeks.   It was a pleasure to see you today!

## 2019-11-02 NOTE — Assessment & Plan Note (Signed)
Doing well on OCP's. Discussed condoms to prevent STD's. Pap smear completed today.  Exam today benign.

## 2019-11-04 LAB — CYTOLOGY - PAP
Comment: NEGATIVE
Diagnosis: NEGATIVE
High risk HPV: NEGATIVE

## 2019-11-24 ENCOUNTER — Ambulatory Visit (INDEPENDENT_AMBULATORY_CARE_PROVIDER_SITE_OTHER): Payer: 59 | Admitting: Psychologist

## 2019-11-24 DIAGNOSIS — F320 Major depressive disorder, single episode, mild: Secondary | ICD-10-CM

## 2019-12-10 ENCOUNTER — Ambulatory Visit (INDEPENDENT_AMBULATORY_CARE_PROVIDER_SITE_OTHER): Payer: 59 | Admitting: Psychologist

## 2019-12-10 DIAGNOSIS — F320 Major depressive disorder, single episode, mild: Secondary | ICD-10-CM

## 2020-01-01 ENCOUNTER — Ambulatory Visit: Payer: 59 | Admitting: Psychologist

## 2020-08-15 DIAGNOSIS — Z20828 Contact with and (suspected) exposure to other viral communicable diseases: Secondary | ICD-10-CM | POA: Diagnosis not present

## 2020-08-24 ENCOUNTER — Other Ambulatory Visit: Payer: Self-pay

## 2020-08-24 ENCOUNTER — Other Ambulatory Visit: Payer: BC Managed Care – PPO

## 2020-08-24 ENCOUNTER — Telehealth (INDEPENDENT_AMBULATORY_CARE_PROVIDER_SITE_OTHER): Payer: Self-pay | Admitting: Primary Care

## 2020-08-24 ENCOUNTER — Telehealth: Payer: Self-pay

## 2020-08-24 DIAGNOSIS — R0982 Postnasal drip: Secondary | ICD-10-CM | POA: Diagnosis not present

## 2020-08-24 NOTE — Telephone Encounter (Signed)
Please advise when we can make app

## 2020-08-24 NOTE — Assessment & Plan Note (Signed)
Acute viral symptoms since 08/12/20, negative test result on 08/22/20. Has been vaccinated. Given her mild symptoms and the fact that her boyfriend tested positive, we will swab her again today.  According to the CDC she's able to come out of isolation with a mask.   Discussed conservative treatment.  Await results.

## 2020-08-24 NOTE — Progress Notes (Signed)
Subjective:    Patient ID: Lindsay Barnett, female    DOB: 20-Jan-1996, 25 y.o.   MRN: [id number]  HPI  Virtual Visit via Video Note  I connected with Lindsay Barnett on 08/24/20 at 10:40 AM EST by a video enabled telemedicine application and verified that I am speaking with the correct person using two identifiers.  We attempted to connect but she could not get her camera enabled. We conducted our visit via phone which lasted 9 min 45 sec.  Location: Patient: Home Provider: Office Participants: Patient and myself   I discussed the limitations of evaluation and management by telemedicine and the availability of in person appointments. The patient expressed understanding and agreed to proceed.  History of Present Illness:  Lindsay Barnett is a 25 year old female who presents today with a chief complaint of rhinorrhea.  She also reports post nasal drip and mild cough.   Symptoms began 08/12/20 with sore throat, headache. She then began to experience rhinorrhea, post nasal drip so she tested for Covid-19 on 08/16/20, results returned 08/22/20 and were negative. Her boyfriend received a positive result, he has been back at work.  She had to file a claim to return to work but was told by HR that she cannot return until 08/29/20. She has had two Covid-19 vaccines.    Observations/Objective:  Alert and oriented. Sounds mildly congested. No distress. Speaking in complete sentences. No cough during visit.  Assessment and Plan:  Acute viral symptoms since 08/12/20, negative test result on 08/22/20. Has been vaccinated. Given her mild symptoms and the fact that her boyfriend tested positive, we will swab her again today.  According to the CDC she's able to come out of isolation with a mask.   Discussed conservative treatment.  Await results.  Follow Up Instructions:  Come by the office today as scheduled.  Be sure to wear a mask anywhere you go or when around others.  We  will be in touch with results once received.   It was a pleasure to see you today! Mayra Reel, NP-C C   I discussed the assessment and treatment plan with the patient. The patient was provided an opportunity to ask questions and all were answered. The patient agreed with the plan and demonstrated an understanding of the instructions.   The patient was advised to call back or seek an in-person evaluation if the symptoms worsen or if the condition fails to improve as anticipated.    Doreene Nest, NP   Review of Systems  Constitutional: Negative for chills, fatigue and fever.  HENT: Positive for congestion, postnasal drip and rhinorrhea.   Respiratory: Positive for cough.   Gastrointestinal: Negative for diarrhea.  Allergic/Immunologic: Negative for environmental allergies.       Past Medical History:  Diagnosis Date  . Upper respiratory disease      Social History   Socioeconomic History  . Marital status: Single    Spouse name: Not on file  . Number of children: Not on file  . Years of education: Not on file  . Highest education level: Not on file  Occupational History  . Not on file  Tobacco Use  . Smoking status: Never Smoker  . Smokeless tobacco: Never Used  Substance and Sexual Activity  . Alcohol use: No    Alcohol/week: 0.0 standard drinks  . Drug use: No  . Sexual activity: Not on file  Other Topics Concern  . Not on file  Social History  Narrative   Single.   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   Enjoys playing on the computer, cooking.    Social Determinants of Health   Financial Resource Strain: Not on file  Food Insecurity: Not on file  Transportation Needs: Not on file  Physical Activity: Not on file  Stress: Not on file  Social Connections: Not on file  Intimate Partner Violence: Not on file    No past surgical history on file.  Family History  Problem Relation Age of Onset  . Lung cancer Maternal Grandfather   .  Hypertension Mother   . Breast cancer Mother 32  . Breast cancer Maternal Aunt        60's    No Known Allergies  Current Outpatient Medications on File Prior to Visit  Medication Sig Dispense Refill  . norethindrone-ethinyl estradiol (JUNEL 1/20) 1-20 MG-MCG tablet Take 1 tablet by mouth daily. 84 tablet 3   No current facility-administered medications on file prior to visit.    Ht 5' 5.5" (1.664 m)   Wt 238 lb (108 kg)   BMI 39.00 kg/m    Objective:   Physical Exam Constitutional:      General: She is not in acute distress.    Appearance: She is not ill-appearing.  Pulmonary:     Effort: Pulmonary effort is normal.  Neurological:     Mental Status: She is alert and oriented to person, place, and time.  Psychiatric:        Mood and Affect: Mood normal.            Assessment & Plan:

## 2020-08-24 NOTE — Telephone Encounter (Signed)
No, I instructed her to schedule an appointment for next week, this way we have time to ensure she has a negative test.    Technically, her symptoms started on 08/12/2020, so she is past the 10-day onset of symptoms, plus a negative COVID test. We will see what this one says, I still recommend next week as discussed with her via phone.

## 2020-08-24 NOTE — Patient Instructions (Signed)
Come by the office today as scheduled.  Be sure to wear a mask anywhere you go or when around others.  We will be in touch with results once received.   It was a pleasure to see you today! Mayra Reel, NP-C

## 2020-08-24 NOTE — Telephone Encounter (Signed)
Pt called to schedule appointment as soon as possible for in person visit. She said that Jae Dire told her it was ok to do. I saw patient was having a covid test today and let her know we need to wait on covid test before bringing you into office. Pt said well Jae Dire told me I was ok since this has been going on for a while. To be on the safe side I am asking when is this patient is allowed to come to office for in person appointment for place under arm and check her back?

## 2020-08-25 NOTE — Telephone Encounter (Signed)
Called appointment made for next week pending neg covid test.

## 2020-08-28 LAB — NOVEL CORONAVIRUS, NAA: SARS-CoV-2, NAA: DETECTED — AB

## 2020-08-30 NOTE — Telephone Encounter (Signed)
Pt called to report she just received a call reporting her covid test results but needs a copy and forgot to ask. Advised a copy would be put at the front desk. Pt verbalized understanding.

## 2020-08-31 ENCOUNTER — Ambulatory Visit: Payer: BC Managed Care – PPO | Admitting: Primary Care

## 2020-09-07 DIAGNOSIS — S93421A Sprain of deltoid ligament of right ankle, initial encounter: Secondary | ICD-10-CM | POA: Diagnosis not present

## 2020-09-19 ENCOUNTER — Ambulatory Visit: Payer: BC Managed Care – PPO | Admitting: Primary Care

## 2020-09-19 ENCOUNTER — Other Ambulatory Visit: Payer: Self-pay

## 2020-09-19 VITALS — BP 124/62 | HR 83 | Temp 97.8°F | Ht 65.5 in | Wt 218.0 lb

## 2020-09-19 DIAGNOSIS — R2231 Localized swelling, mass and lump, right upper limb: Secondary | ICD-10-CM | POA: Diagnosis not present

## 2020-09-19 NOTE — Progress Notes (Signed)
Subjective:    Patient ID: Lindsay Barnett, female    DOB: 12-31-1995, 25 y.o.   MRN: [id number]  HPI  This visit occurred during the SARS-CoV-2 public health emergency.  Safety protocols were in place, including screening questions prior to the visit, additional usage of staff PPE, and extensive cleaning of exam room while observing appropriate contact time as indicated for disinfecting solutions.   Lindsay Barnett is a 25 year old female with a history of left axillary mass who presents today with a chief complaint of axillary mass.  Her mass is located under the right axilla for which she first noticed 2-3 weeks ago. She describes the mass as immobile, pea size, tender.Family history of breast cancer in her mother who was diagnosed at the age of 69.  She cannot always find the mass on palpation, but her mother has found it on several occasions. She denies drainage, erythema, skin texture changes.   Review of Systems  Skin: Negative for color change.       Right axillary mass       Past Medical History:  Diagnosis Date  . Upper respiratory disease      Social History   Socioeconomic History  . Marital status: Single    Spouse name: Not on file  . Number of children: Not on file  . Years of education: Not on file  . Highest education level: Not on file  Occupational History  . Not on file  Tobacco Use  . Smoking status: Never Smoker  . Smokeless tobacco: Never Used  Substance and Sexual Activity  . Alcohol use: No    Alcohol/week: 0.0 standard drinks  . Drug use: No  . Sexual activity: Not on file  Other Topics Concern  . Not on file  Social History Narrative   Single.   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   Enjoys playing on the computer, cooking.    Social Determinants of Health   Financial Resource Strain: Not on file  Food Insecurity: Not on file  Transportation Needs: Not on file  Physical Activity: Not on file  Stress: Not on file   Social Connections: Not on file  Intimate Partner Violence: Not on file    No past surgical history on file.  Family History  Problem Relation Age of Onset  . Lung cancer Maternal Grandfather   . Hypertension Mother   . Breast cancer Mother 47  . Breast cancer Maternal Aunt        60's    No Known Allergies  Current Outpatient Medications on File Prior to Visit  Medication Sig Dispense Refill  . norethindrone-ethinyl estradiol (JUNEL 1/20) 1-20 MG-MCG tablet Take 1 tablet by mouth daily. 84 tablet 3   No current facility-administered medications on file prior to visit.    BP 124/62   Pulse 83   Temp 97.8 F (36.6 C) (Temporal)   Ht 5' 5.5" (1.664 m)   Wt 218 lb (98.9 kg)   SpO2 99%   BMI 35.73 kg/m    Objective:   Physical Exam Cardiovascular:     Rate and Rhythm: Normal rate.  Pulmonary:     Effort: Pulmonary effort is normal.  Chest:     Comments: No axillar mass noted to either axilla.  Skin appears normal. No texture abnormalities, breakdown, erythema.  Neurological:     Mental Status: She is alert.            Assessment & Plan:

## 2020-09-19 NOTE — Patient Instructions (Signed)
You will be contacted regarding your breast and axillary ultrasound.  Please let us know if you have not been contacted within two weeks.   It was a pleasure to see you today!

## 2020-09-19 NOTE — Assessment & Plan Note (Signed)
Acutely noted by patient and mother 2-3 weeks ago. No masses or abnormalities noted to either axilla today.  Given FH of breast cancer in mother will obtain US of right breast and axilla.   Await results.

## 2020-09-26 ENCOUNTER — Other Ambulatory Visit: Payer: BC Managed Care – PPO

## 2020-10-28 ENCOUNTER — Other Ambulatory Visit: Payer: Self-pay

## 2020-10-28 ENCOUNTER — Ambulatory Visit
Admission: RE | Admit: 2020-10-28 | Discharge: 2020-10-28 | Disposition: A | Discharge status: Home / Self Care | Payer: BC Managed Care – PPO | Source: Ambulatory Visit | Attending: Primary Care | Admitting: Primary Care

## 2020-10-28 DIAGNOSIS — R2231 Localized swelling, mass and lump, right upper limb: Secondary | ICD-10-CM

## 2020-12-07 ENCOUNTER — Other Ambulatory Visit: Payer: Self-pay | Admitting: Primary Care

## 2020-12-07 DIAGNOSIS — Z3041 Encounter for surveillance of contraceptive pills: Secondary | ICD-10-CM

## 2020-12-07 NOTE — Telephone Encounter (Signed)
Patient is due for CPE/follow up, this will be required prior to any further refills.  Please schedule.   

## 2021-01-05 NOTE — Telephone Encounter (Signed)
lvmtcb to schedule CPE 

## 2021-01-10 ENCOUNTER — Other Ambulatory Visit: Payer: Self-pay | Admitting: Primary Care

## 2021-01-10 DIAGNOSIS — Z3041 Encounter for surveillance of contraceptive pills: Secondary | ICD-10-CM

## 2021-01-10 NOTE — Telephone Encounter (Signed)
Patient has not been seen for general follow-up/birth control follow-up since March 2021.  Needs CPE for further refills, please schedule.

## 2021-01-11 ENCOUNTER — Telehealth: Payer: Self-pay | Admitting: Primary Care

## 2021-01-11 NOTE — Telephone Encounter (Signed)
Left message to return call to our office.  

## 2021-01-11 NOTE — Telephone Encounter (Signed)
Pt calling back to speak with Nurse

## 2021-01-12 NOTE — Telephone Encounter (Signed)
Called patient app made for CPE informed must keep to get next refills.

## 2021-01-12 NOTE — Telephone Encounter (Signed)
See refill request called patient appointment made.

## 2021-01-24 NOTE — Telephone Encounter (Signed)
Patient is scheduled   

## 2021-01-29 IMAGING — US ULTRASOUND LEFT BREAST LIMITED
1 series · 7 of 7 positions shown · non-contrast
Comparison: None

CLINICAL DATA: 23-year-old female with palpable thickening in the
LOWER OUTER LEFT breast discovered on clinical examination and
palpable fullness in the LEFT axilla identified on self-examination.

EXAM:
ULTRASOUND OF THE LEFT BREAST

[Series 1: ultrasound left breast limited · 0.08mm/px · 7 of 7 slices shown]
[im 1/7]
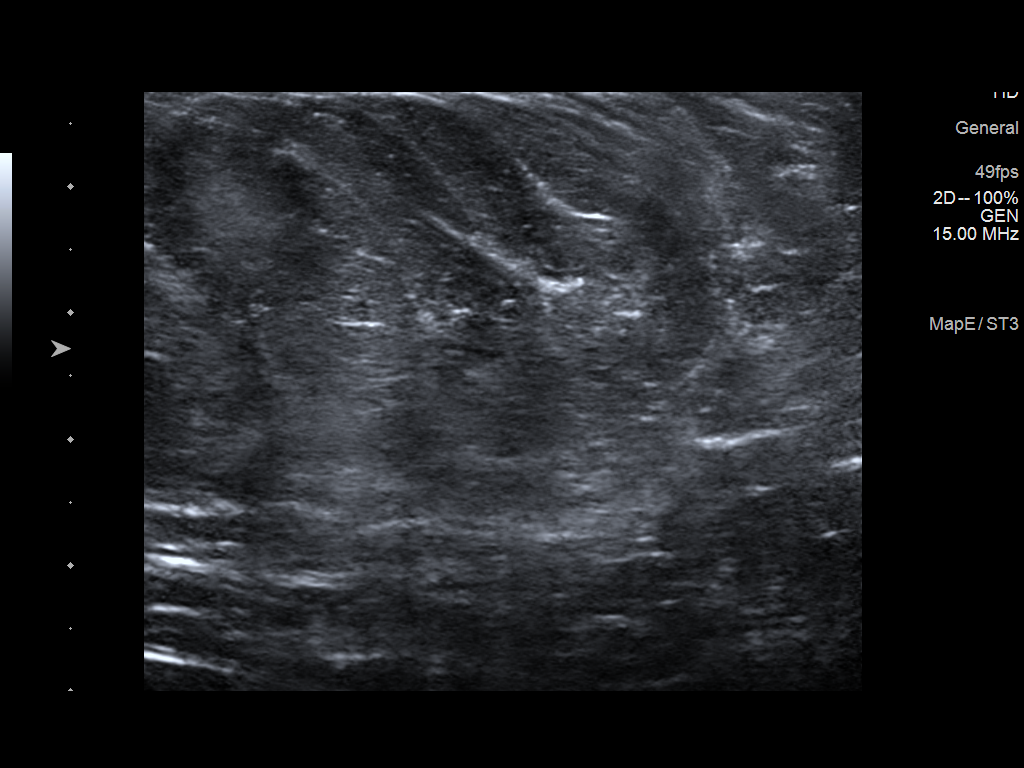
[im 2/7]
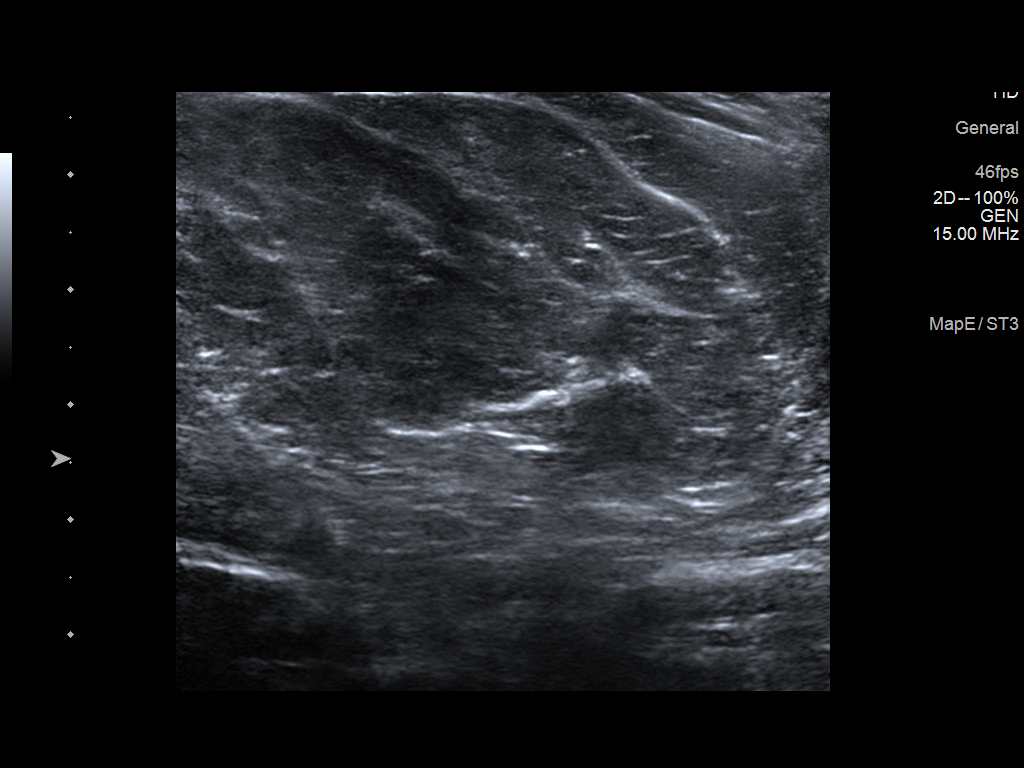
[im 3/7]
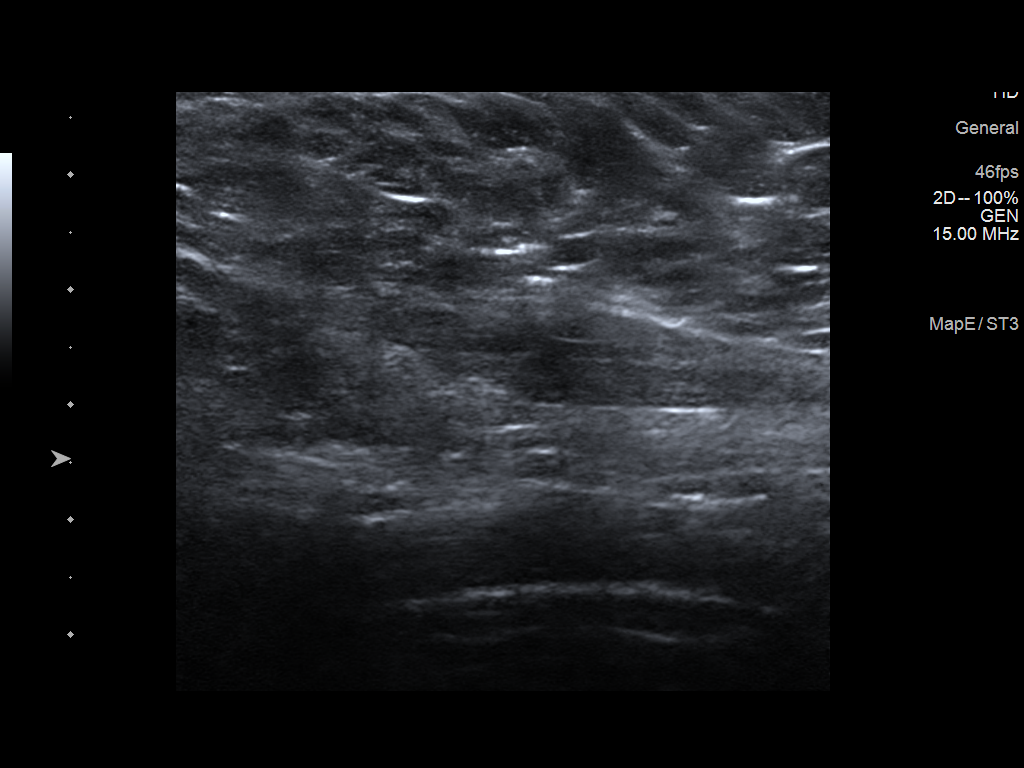
[im 4/7]
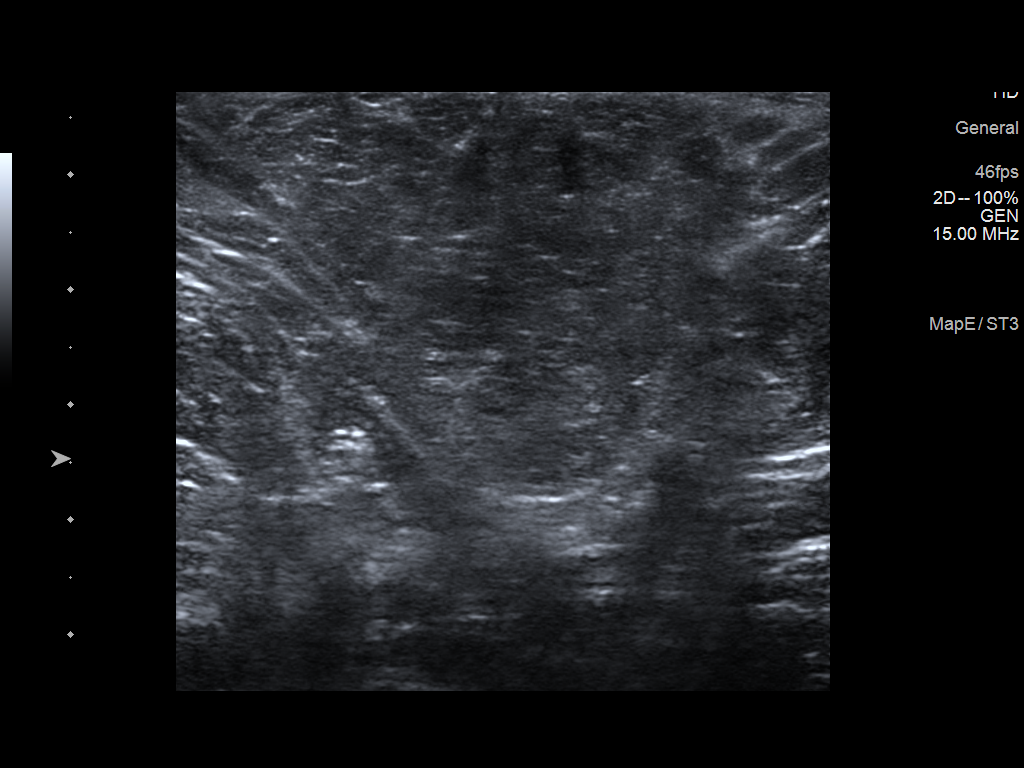
[im 5/7]
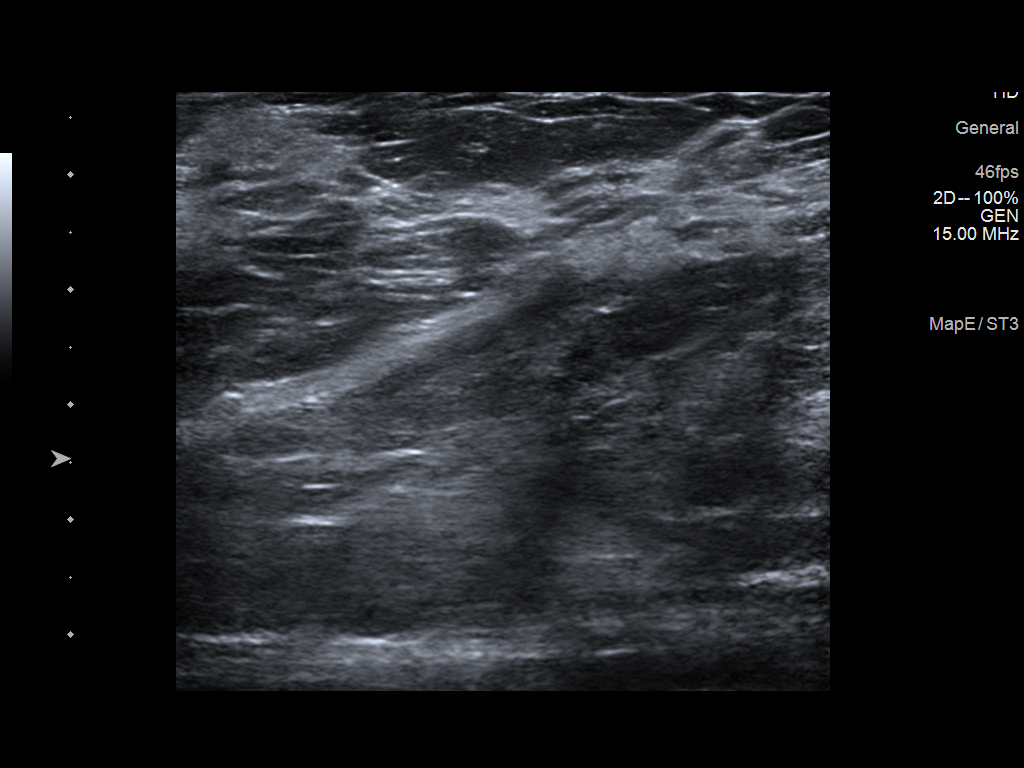
[im 6/7]
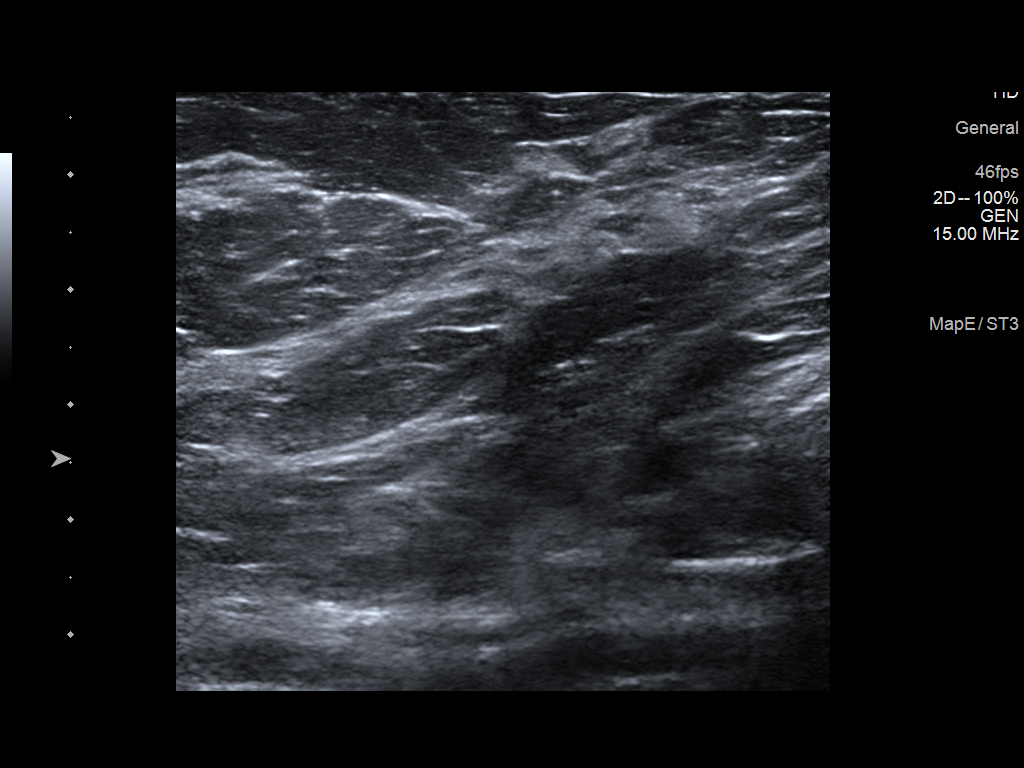
[im 7/7]
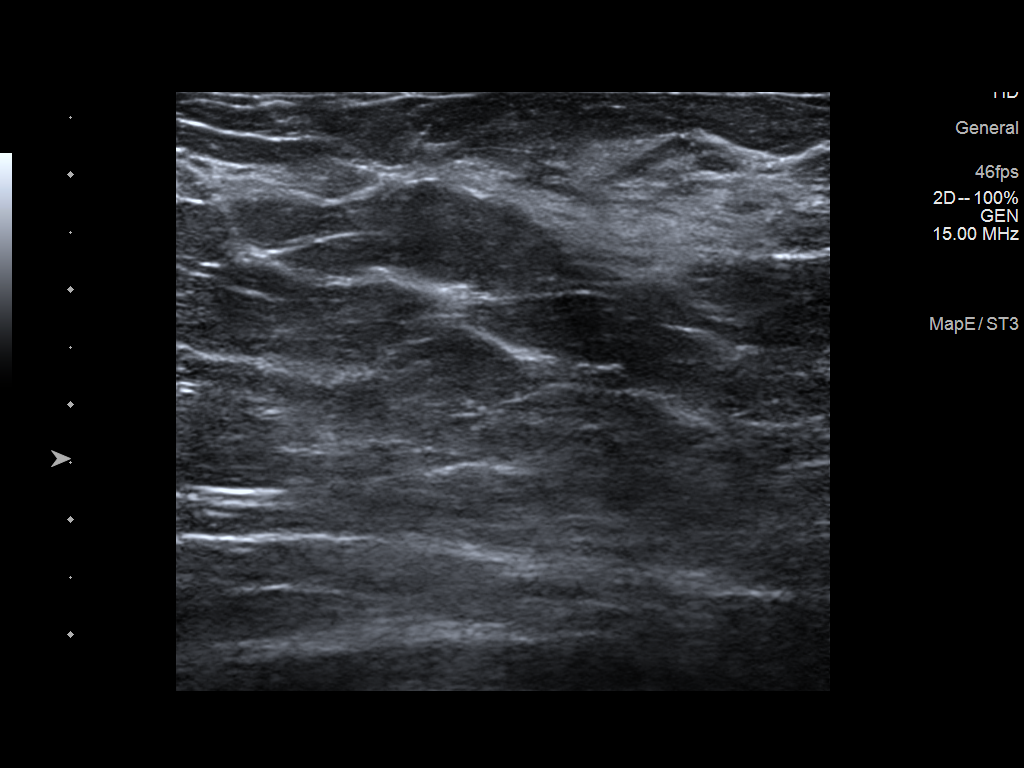

[7 of 7 positions shown; findings below may reference images not displayed]

FINDINGS: On physical exam, minimal thickening in the LOWER OUTER LEFT breast
identified. Soft fullness in the LEFT axilla is identified.

Targeted ultrasound is performed, showing no solid or cystic mass,
distortion or abnormal shadowing within the LOWER OUTER LEFT breast
or LEFT axilla. No abnormal LEFT axillary lymph nodes are
identified.
IMPRESSION: No suspicious palpable or sonographic abnormalities in the areas of
palpable concern.

RECOMMENDATION:
Bilateral screening mammogram at age 41.

Clinical follow-up recommended for the area of concern in the LEFT
breast. Any further workup should be based on clinical grounds.

I have discussed the findings and recommendations with the patient.
If applicable, a reminder letter will be sent to the patient
regarding the next appointment.

BI-RADS CATEGORY  1: Negative.

## 2021-01-31 ENCOUNTER — Encounter: Payer: Self-pay | Admitting: Primary Care

## 2021-01-31 ENCOUNTER — Ambulatory Visit: Payer: BC Managed Care – PPO | Admitting: Primary Care

## 2021-01-31 ENCOUNTER — Other Ambulatory Visit: Payer: Self-pay

## 2021-01-31 VITALS — BP 126/72 | HR 100 | Temp 98.3°F | Ht 66.0 in | Wt 220.0 lb

## 2021-01-31 DIAGNOSIS — Z0000 Encounter for general adult medical examination without abnormal findings: Secondary | ICD-10-CM

## 2021-01-31 DIAGNOSIS — Z23 Encounter for immunization: Secondary | ICD-10-CM

## 2021-01-31 DIAGNOSIS — Z0001 Encounter for general adult medical examination with abnormal findings: Secondary | ICD-10-CM | POA: Insufficient documentation

## 2021-01-31 DIAGNOSIS — Z3041 Encounter for surveillance of contraceptive pills: Secondary | ICD-10-CM

## 2021-01-31 DIAGNOSIS — R21 Rash and other nonspecific skin eruption: Secondary | ICD-10-CM

## 2021-01-31 MED ORDER — NORETHINDRONE ACET-ETHINYL EST 1-20 MG-MCG PO TABS: 1.0000 | ORAL_TABLET | Freq: Every day | ORAL | 3 refills | Status: DC

## 2021-01-31 NOTE — Assessment & Plan Note (Signed)
Likely fungal/yeast from sweating under breasts. Discussed OTC treatment and to keep area dry.  She will update if no improvement.

## 2021-01-31 NOTE — Progress Notes (Signed)
Subjective:    Patient ID: Lindsay Barnett, female    DOB: 02-03-1996, 25 y.o.   MRN: [id number]  HPI  Lindsay Barnett is a very pleasant 25 y.o. female who presents today for complete physical.  She would also like mention an intermittent rash, occurs under breasts intermittently. Not itchy, will burn. Sometimes with a foul smell. She's not tried anything OTC.   Immunizations: -Tetanus: Tetanus unsure.  -Influenza: Did not receive last year -Covid-19: 2 vaccines   -HPV: Never completed   Diet: Fair diet. She is considering Optavia.  Exercise: No regular exercise.  Eye exam: Completes annually  Dental exam: Completes semi-annually   Pap Smear: Completed in 2021  BP Readings from Last 3 Encounters:  01/31/21 126/72  09/19/20 124/62  11/02/19 118/72        Review of Systems  Constitutional:  Negative for unexpected weight change.  HENT:  Negative for rhinorrhea.   Eyes:  Negative for visual disturbance.  Respiratory:  Negative for cough and shortness of breath.   Cardiovascular:  Negative for chest pain.  Gastrointestinal:  Negative for constipation and diarrhea.  Genitourinary:  Negative for difficulty urinating and menstrual problem.  Musculoskeletal:  Negative for arthralgias and myalgias.  Skin:  Negative for rash.  Allergic/Immunologic: Negative for environmental allergies.  Neurological:  Negative for dizziness, numbness and headaches.  Psychiatric/Behavioral:  The patient is not nervous/anxious.         Past Medical History:  Diagnosis Date   Upper respiratory disease     Social History   Socioeconomic History   Marital status: Single    Spouse name: Not on file   Number of children: Not on file   Years of education: Not on file   Highest education level: Not on file  Occupational History   Not on file  Tobacco Use   Smoking status: Never   Smokeless tobacco: Never  Substance and Sexual Activity   Alcohol use: No    Alcohol/week: 0.0  standard drinks   Drug use: No   Sexual activity: Not on file  Other Topics Concern   Not on file  Social History Narrative   Single.   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   Enjoys playing on the computer, cooking.    Social Determinants of Health   Financial Resource Strain: Not on file  Food Insecurity: Not on file  Transportation Needs: Not on file  Physical Activity: Not on file  Stress: Not on file  Social Connections: Not on file  Intimate Partner Violence: Not on file    History reviewed. No pertinent surgical history.  Family History  Problem Relation Age of Onset   Lung cancer Maternal Grandfather    Hypertension Mother    Breast cancer Mother 75   Breast cancer Maternal Aunt        60's    No Known Allergies  No current outpatient medications on file prior to visit.   No current facility-administered medications on file prior to visit.    BP 126/72   Pulse 100   Temp 98.3 F (36.8 C) (Temporal)   Ht 5\' 6"  (1.676 m)   Wt 220 lb (99.8 kg)   LMP 01/29/2021 (Exact Date)   SpO2 98%   BMI 35.51 kg/m  Objective:   Physical Exam HENT:     Right Ear: Tympanic membrane and ear canal normal.     Left Ear: Tympanic membrane and ear canal normal.  Nose: Nose normal.  Eyes:     Conjunctiva/sclera: Conjunctivae normal.     Pupils: Pupils are equal, round, and reactive to light.  Neck:     Thyroid: No thyromegaly.  Cardiovascular:     Rate and Rhythm: Normal rate and regular rhythm.     Heart sounds: No murmur heard. Pulmonary:     Effort: Pulmonary effort is normal.     Breath sounds: Normal breath sounds. No rales.  Abdominal:     General: Bowel sounds are normal.     Palpations: Abdomen is soft.     Tenderness: There is no abdominal tenderness.  Musculoskeletal:        General: Normal range of motion.     Cervical back: Neck supple.  Lymphadenopathy:     Cervical: No cervical adenopathy.  Skin:    General: Skin is warm and  dry.     Findings: No rash.     Comments: Mild erythematous rash under breasts. No foul smell. No skin breakdown.   Neurological:     Mental Status: She is alert and oriented to person, place, and time.     Cranial Nerves: No cranial nerve deficit.     Deep Tendon Reflexes: Reflexes are normal and symmetric.  Psychiatric:        Mood and Affect: Mood normal.          Assessment & Plan:      This visit occurred during the SARS-CoV-2 public health emergency.  Safety protocols were in place, including screening questions prior to the visit, additional usage of staff PPE, and extensive cleaning of exam room while observing appropriate contact time as indicated for disinfecting solutions.

## 2021-01-31 NOTE — Addendum Note (Signed)
Addended by: Donnamarie Poag on: 01/31/2021 10:20 AM   Modules accepted: Orders

## 2021-01-31 NOTE — Assessment & Plan Note (Signed)
Doing well on OCP's, refills provided today.  Pap smear UTD.

## 2021-01-31 NOTE — Patient Instructions (Signed)
Try some anti-fungal powder or cream under the breasts twice daily for about one week. You can get this at the drug store.   Schedule a nurse visit to return for your second HPV vaccine in 2 months (around mid to late August).  Schedule a nurse visit to return for your final HPV vaccine in 6 months (around mid to late December).   It was a pleasure to see you today!  Preventive Care 7-24 Years Old, Female Preventive care refers to lifestyle choices and visits with your health care provider that can promote health and wellness. This includes: A yearly physical exam. This is also called an annual wellness visit. Regular dental and eye exams. Immunizations. Screening for certain conditions. Healthy lifestyle choices, such as: Eating a healthy diet. Getting regular exercise. Not using drugs or products that contain nicotine and tobacco. Limiting alcohol use. What can I expect for my preventive care visit? Physical exam Your health care provider may check your: Height and weight. These may be used to calculate your BMI (body mass index). BMI is a measurement that tells if you are at a healthy weight. Heart rate and blood pressure. Body temperature. Skin for abnormal spots. Counseling Your health care provider may ask you questions about your: Past medical problems. Family's medical history. Alcohol, tobacco, and drug use. Emotional well-being. Home life and relationship well-being. Sexual activity. Diet, exercise, and sleep habits. Work and work Statistician. Access to firearms. Method of birth control. Menstrual cycle. Pregnancy history. What immunizations do I need?  Vaccines are usually given at various ages, according to a schedule. Your health care provider will recommend vaccines for you based on your age, medicalhistory, and lifestyle or other factors, such as travel or where you work. What tests do I need?  Blood tests Lipid and cholesterol levels. These may be  checked every 5 years starting at age 79. Hepatitis C test. Hepatitis B test. Screening Diabetes screening. This is done by checking your blood sugar (glucose) after you have not eaten for a while (fasting). STD (sexually transmitted disease) testing, if you are at risk. BRCA-related cancer screening. This may be done if you have a family history of breast, ovarian, tubal, or peritoneal cancers. Pelvic exam and Pap test. This may be done every 3 years starting at age 64. Starting at age 2, this may be done every 5 years if you have a Pap test in combination with an HPV test. Talk with your health care provider about your test results, treatment options,and if necessary, the need for more tests. Follow these instructions at home: Eating and drinking  Eat a healthy diet that includes fresh fruits and vegetables, whole grains, lean protein, and low-fat dairy products. Take vitamin and mineral supplements as recommended by your health care provider. Do not drink alcohol if: Your health care provider tells you not to drink. You are pregnant, may be pregnant, or are planning to become pregnant. If you drink alcohol: Limit how much you have to 0-1 drink a day. Be aware of how much alcohol is in your drink. In the [place] one drink equals one 12 oz bottle of beer (355 mL), one 5 oz glass of wine (148 mL), or one 1 oz glass of hard liquor (44 mL).  Lifestyle Take daily care of your teeth and gums. Brush your teeth every morning and night with fluoride toothpaste. Floss one time each day. Stay active. Exercise for at least 30 minutes 5 or more days each week. Do not  use any products that contain nicotine or tobacco, such as cigarettes, e-cigarettes, and chewing tobacco. If you need help quitting, ask your health care provider. Do not use drugs. If you are sexually active, practice safe sex. Use a condom or other form of protection to prevent STIs (sexually transmitted infections). If you do not wish  to become pregnant, use a form of birth control. If you plan to become pregnant, see your health care provider for a prepregnancy visit. Find healthy ways to cope with stress, such as: Meditation, yoga, or listening to music. Journaling. Talking to a trusted person. Spending time with friends and family. Safety Always wear your seat belt while driving or riding in a vehicle. Do not drive: If you have been drinking alcohol. Do not ride with someone who has been drinking. When you are tired or distracted. While texting. Wear a helmet and other protective equipment during sports activities. If you have firearms in your house, make sure you follow all gun safety procedures. Seek help if you have been physically or sexually abused. What's next? Go to your health care provider once a year for an annual wellness visit. Ask your health care provider how often you should have your eyes and teeth checked. Stay up to date on all vaccines. This information is not intended to replace advice given to you by your health care provider. Make sure you discuss any questions you have with your healthcare provider. Document Revised: 03/27/2020 Document Reviewed: 04/10/2018 Elsevier Patient Education  2022 [employer].

## 2021-01-31 NOTE — Assessment & Plan Note (Signed)
Tetanus and first HPV vaccine provided today. Pap smear UTD.  Discussed the importance of a healthy diet and regular exercise in order for weight loss, and to reduce the risk of further co-morbidity.  Exam today as noted.

## 2021-04-05 ENCOUNTER — Ambulatory Visit (INDEPENDENT_AMBULATORY_CARE_PROVIDER_SITE_OTHER): Payer: BC Managed Care – PPO

## 2021-04-05 ENCOUNTER — Other Ambulatory Visit: Payer: Self-pay

## 2021-04-05 DIAGNOSIS — Z23 Encounter for immunization: Secondary | ICD-10-CM | POA: Diagnosis not present

## 2021-04-05 NOTE — Progress Notes (Signed)
Patient presented for 2nd HPV vaccine given by Ewa Hipp, CMA to right deltoid, patient voiced no concerns nor showed any signs of distress during injection.  

## 2021-08-08 ENCOUNTER — Ambulatory Visit: Payer: BC Managed Care – PPO

## 2021-10-10 ENCOUNTER — Ambulatory Visit: Payer: BC Managed Care – PPO

## 2021-10-18 ENCOUNTER — Other Ambulatory Visit: Payer: Self-pay

## 2021-10-18 ENCOUNTER — Ambulatory Visit (INDEPENDENT_AMBULATORY_CARE_PROVIDER_SITE_OTHER): Payer: BC Managed Care – PPO

## 2021-10-18 DIAGNOSIS — Z23 Encounter for immunization: Secondary | ICD-10-CM | POA: Diagnosis not present

## 2022-02-17 ENCOUNTER — Other Ambulatory Visit: Payer: Self-pay | Admitting: Primary Care

## 2022-02-17 DIAGNOSIS — Z3041 Encounter for surveillance of contraceptive pills: Secondary | ICD-10-CM

## 2022-02-18 NOTE — Telephone Encounter (Signed)
Patient is due for CPE/follow up, this will be required prior to any further refills.  Please schedule.   

## 2022-02-19 NOTE — Telephone Encounter (Signed)
Patient scheduled CPE for 03/16/22

## 2022-03-16 ENCOUNTER — Ambulatory Visit (INDEPENDENT_AMBULATORY_CARE_PROVIDER_SITE_OTHER): Payer: BC Managed Care – PPO | Admitting: Primary Care

## 2022-03-16 ENCOUNTER — Encounter: Payer: Self-pay | Admitting: Primary Care

## 2022-03-16 VITALS — BP 122/82 | HR 98 | Temp 98.6°F | Ht 66.0 in | Wt 215.0 lb

## 2022-03-16 DIAGNOSIS — Z0001 Encounter for general adult medical examination with abnormal findings: Secondary | ICD-10-CM

## 2022-03-16 DIAGNOSIS — Z1159 Encounter for screening for other viral diseases: Secondary | ICD-10-CM | POA: Diagnosis not present

## 2022-03-16 DIAGNOSIS — R55 Syncope and collapse: Secondary | ICD-10-CM

## 2022-03-16 DIAGNOSIS — Z3041 Encounter for surveillance of contraceptive pills: Secondary | ICD-10-CM | POA: Diagnosis not present

## 2022-03-16 DIAGNOSIS — Z114 Encounter for screening for human immunodeficiency virus [HIV]: Secondary | ICD-10-CM | POA: Diagnosis not present

## 2022-03-16 LAB — TSH: TSH: 0.3 u[IU]/mL — ABNORMAL LOW (ref 0.35–5.50)

## 2022-03-16 LAB — COMPREHENSIVE METABOLIC PANEL
ALT: 16 U/L (ref 0–35)
AST: 15 U/L (ref 0–37)
Albumin: 4.4 g/dL (ref 3.5–5.2)
Alkaline Phosphatase: 70 U/L (ref 39–117)
BUN: 9 mg/dL (ref 6–23)
CO2: 26 mEq/L (ref 19–32)
Calcium: 9.4 mg/dL (ref 8.4–10.5)
Chloride: 106 mEq/L (ref 96–112)
Creatinine, Ser: 0.82 mg/dL (ref 0.40–1.20)
GFR: 99.55 mL/min (ref >60.00)
Glucose, Bld: 82 mg/dL (ref 70–99)
Potassium: 3.9 mEq/L (ref 3.5–5.1)
Sodium: 139 mEq/L (ref 135–145)
Total Bilirubin: 0.4 mg/dL (ref 0.2–1.2)
Total Protein: 7.2 g/dL (ref 6.0–8.3)

## 2022-03-16 LAB — CBC
HCT: 41.8 % (ref 36.0–46.0)
Hemoglobin: 14.2 g/dL (ref 12.0–15.0)
MCHC: 34.1 g/dL (ref 30.0–36.0)
MCV: 90.2 fl (ref 78.0–100.0)
Platelets: 310 10*3/uL (ref 150.0–400.0)
RBC: 4.63 Mil/uL (ref 3.87–5.11)
RDW: 13.6 % (ref 11.5–15.5)
WBC: 5.4 10*3/uL (ref 4.0–10.5)

## 2022-03-16 LAB — POCT URINE PREGNANCY: Preg Test, Ur: NEGATIVE

## 2022-03-16 MED ORDER — NORETHINDRONE ACET-ETHINYL EST 1-20 MG-MCG PO TABS: 1.0000 | ORAL_TABLET | Freq: Every day | ORAL | 3 refills | Status: DC

## 2022-03-16 NOTE — Assessment & Plan Note (Addendum)
Out of OCP's for one month.   Urine pregnancy test negative today.  Refills provided today. Discussed instructions for restarting her OCP's.   Pap smear UTD.

## 2022-03-16 NOTE — Progress Notes (Signed)
Subjective:    Patient ID: Lindsay Barnett, female    DOB: Sep 26, 1995, 26 y.o.   MRN: [id number]  HPI  Lindsay Barnett is a very pleasant 26 y.o. female who presents today for complete physical and follow up of chronic conditions.  She would also like to discuss near syncope. Chronic since 2017, occurring more frequently (monthly) over the last several months. Her episodes always occurs when standing and doing something. She will then begin to feel ear fullness unilaterally, begin feeling dizzy. Typically she will either sit or lay down and symptoms will resolve in about 5 minutes. She denies palpitations each time but has noticed a few times.   He endorses passing out one year ago while standing in line at a theme park. She remembers it was hot that day. She began to notice disorder vision, passed out, friend witnessed this who mentioned that her eyes rolled back, her friend had to hold her body up. This lasted for a few seconds. Improved after drinking water.   Evaluated at [facility] in 2017 for syncope, treated with IV fluids and potassium, discharged home.   She denies a family history of structural heart disorder or seizure disorder.   Immunizations: -Tetanus: 2022 -Influenza:  Did not completed last season  -Covid-19: 2 vaccines  -HPV: Completed series   Diet: Fair diet.  Exercise: No regular exercise.  Eye exam: Completed several years ago  Dental exam: Completed over 1 year ago  Pap Smear: Completed in 2021   BP Readings from Last 3 Encounters:  03/16/22 122/82  01/31/21 126/72  09/19/20 124/62        Review of Systems  Constitutional:  Negative for unexpected weight change.  HENT:  Negative for rhinorrhea.   Respiratory:  Negative for cough and shortness of breath.   Cardiovascular:  Negative for chest pain.  Gastrointestinal:  Negative for constipation and diarrhea.  Genitourinary:  Negative for difficulty urinating and menstrual problem.  Musculoskeletal:   Positive for arthralgias.  Skin:  Negative for rash.  Allergic/Immunologic: Negative for environmental allergies.  Neurological:  Negative for dizziness and headaches.       Near syncope  Psychiatric/Behavioral:  The patient is not nervous/anxious.          Past Medical History:  Diagnosis Date   Upper respiratory disease     Social History   Socioeconomic History   Marital status: Single    Spouse name: Not on file   Number of children: Not on file   Years of education: Not on file   Highest education level: Not on file  Occupational History   Not on file  Tobacco Use   Smoking status: Never   Smokeless tobacco: Never  Substance and Sexual Activity   Alcohol use: No    Alcohol/week: 0.0 standard drinks of alcohol   Drug use: No   Sexual activity: Not on file  Other Topics Concern   Not on file  Social History Narrative   Single.   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   Enjoys playing on the computer, cooking.    Social Determinants of Health   Financial Resource Strain: Not on file  Food Insecurity: Not on file  Transportation Needs: Not on file  Physical Activity: Not on file  Stress: Not on file  Social Connections: Not on file  Intimate Partner Violence: Not on file    History reviewed. No pertinent surgical history.  Family History  Problem Relation Age of  Onset   Lung cancer Maternal Grandfather    Hypertension Mother    Breast cancer Mother 13   Breast cancer Maternal Aunt        60's    No Known Allergies  Current Outpatient Medications on File Prior to Visit  Medication Sig Dispense Refill   norethindrone-ethinyl estradiol (JUNEL 1/20) 1-20 MG-MCG tablet Take 1 tablet by mouth daily. Office visit required for further refills. 28 tablet 0   No current facility-administered medications on file prior to visit.    BP 122/82   Pulse 98   Temp 98.6 F (37 C) (Oral)   Ht 5\' 6"  (1.676 m)   Wt 215 lb (97.5 kg)   LMP  02/26/2022   SpO2 99%   BMI 34.70 kg/m  Objective:   Physical Exam HENT:     Right Ear: Tympanic membrane and ear canal normal.     Left Ear: Tympanic membrane and ear canal normal.     Nose: Nose normal.  Eyes:     Conjunctiva/sclera: Conjunctivae normal.     Pupils: Pupils are equal, round, and reactive to light.  Neck:     Thyroid: No thyromegaly.  Cardiovascular:     Rate and Rhythm: Normal rate and regular rhythm.     Heart sounds: No murmur heard. Pulmonary:     Effort: Pulmonary effort is normal.     Breath sounds: Normal breath sounds. No rales.  Abdominal:     General: Bowel sounds are normal.     Palpations: Abdomen is soft.     Tenderness: There is no abdominal tenderness.  Musculoskeletal:        General: Normal range of motion.     Cervical back: Neck supple.  Lymphadenopathy:     Cervical: No cervical adenopathy.  Skin:    General: Skin is warm and dry.     Findings: No rash.  Neurological:     Mental Status: She is alert and oriented to person, place, and time.     Cranial Nerves: No cranial nerve deficit.     Deep Tendon Reflexes: Reflexes are normal and symmetric.  Psychiatric:        Mood and Affect: Mood normal.           Assessment & Plan:   Problem List Items Addressed This Visit       Other   Encounter for surveillance of contraceptive pills    Out of OCP's for one month.   Urine pregnancy test negative today.  Refills provided today. Discussed instructions for restarting her OCP's.   Pap smear UTD.        Relevant Orders   POCT urine pregnancy   Syncope    Reviewed ED visit from 2017.  Chronic and ongoing, mostly near syncope. Checking labs today.  Referral placed to cardiology for further evaluation.       Relevant Orders   TSH   Comprehensive metabolic panel   CBC   Ambulatory referral to Cardiology   Encounter for annual general medical examination with abnormal findings in adult - Primary    Immunizations  UTD. Pap smear UTD.  Discussed the importance of a healthy diet and regular exercise in order for weight loss, and to reduce the risk of further co-morbidity.  Exam stable. Labs pending.  Follow up in 1 year for repeat physical.       Other Visit Diagnoses     Screening for HIV (human immunodeficiency virus)  Relevant Orders   HIV antibody (with reflex)   Encounter for hepatitis C screening test for low risk patient       Relevant Orders   Hepatitis C Antibody          Doreene Nest, NP

## 2022-03-16 NOTE — Assessment & Plan Note (Signed)
Immunizations UTD. Pap smear UTD  Discussed the importance of a healthy diet and regular exercise in order for weight loss, and to reduce the risk of further co-morbidity.  Exam stable. Labs pending.  Follow up in 1 year for repeat physical.  

## 2022-03-16 NOTE — Assessment & Plan Note (Signed)
Reviewed ED visit from 2017.  Chronic and ongoing, mostly near syncope. Checking labs today.  Referral placed to cardiology for further evaluation.

## 2022-03-16 NOTE — Patient Instructions (Signed)
Stop by the lab prior to leaving today. I will notify you of your results once received.   You will be contacted regarding your referral to cardiology.  Please let us know if you have not been contacted within two weeks.   It was a pleasure to see you today!  Preventive Care 45-25 Years Old, Female Preventive care refers to lifestyle choices and visits with your health care provider that can promote health and wellness. Preventive care visits are also called wellness exams. What can I expect for my preventive care visit? Counseling During your preventive care visit, your health care provider may ask about your: Medical history, including: Past medical problems. Family medical history. Pregnancy history. Current health, including: Menstrual cycle. Method of birth control. Emotional well-being. Home life and relationship well-being. Sexual activity and sexual health. Lifestyle, including: Alcohol, nicotine or tobacco, and drug use. Access to firearms. Diet, exercise, and sleep habits. Work and work Statistician. Sunscreen use. Safety issues such as seatbelt and bike helmet use. Physical exam Your health care provider may check your: Height and weight. These may be used to calculate your BMI (body mass index). BMI is a measurement that tells if you are at a healthy weight. Waist circumference. This measures the distance around your waistline. This measurement also tells if you are at a healthy weight and may help predict your risk of certain diseases, such as type 2 diabetes and high blood pressure. Heart rate and blood pressure. Body temperature. Skin for abnormal spots. What immunizations do I need?  Vaccines are usually given at various ages, according to a schedule. Your health care provider will recommend vaccines for you based on your age, medical history, and lifestyle or other factors, such as travel or where you work. What tests do I need? Screening Your health care  provider may recommend screening tests for certain conditions. This may include: Pelvic exam and Pap test. Lipid and cholesterol levels. Diabetes screening. This is done by checking your blood sugar (glucose) after you have not eaten for a while (fasting). Hepatitis B test. Hepatitis C test. HIV (human immunodeficiency virus) test. STI (sexually transmitted infection) testing, if you are at risk. BRCA-related cancer screening. This may be done if you have a family history of breast, ovarian, tubal, or peritoneal cancers. Talk with your health care provider about your test results, treatment options, and if necessary, the need for more tests. Follow these instructions at home: Eating and drinking  Eat a healthy diet that includes fresh fruits and vegetables, whole grains, lean protein, and low-fat dairy products. Take vitamin and mineral supplements as recommended by your health care provider. Do not drink alcohol if: Your health care provider tells you not to drink. You are pregnant, may be pregnant, or are planning to become pregnant. If you drink alcohol: Limit how much you have to 0-1 drink a day. Know how much alcohol is in your drink. In the [place] one drink equals one 12 oz bottle of beer (355 mL), one 5 oz glass of wine (148 mL), or one 1 oz glass of hard liquor (44 mL). Lifestyle Brush your teeth every morning and night with fluoride toothpaste. Floss one time each day. Exercise for at least 30 minutes 5 or more days each week. Do not use any products that contain nicotine or tobacco. These products include cigarettes, chewing tobacco, and vaping devices, such as e-cigarettes. If you need help quitting, ask your health care provider. Do not use drugs. If you are sexually  active, practice safe sex. Use a condom or other form of protection to prevent STIs. If you do not wish to become pregnant, use a form of birth control. If you plan to become pregnant, see your health care provider  for a prepregnancy visit. Find healthy ways to manage stress, such as: Meditation, yoga, or listening to music. Journaling. Talking to a trusted person. Spending time with friends and family. Minimize exposure to UV radiation to reduce your risk of skin cancer. Safety Always wear your seat belt while driving or riding in a vehicle. Do not drive: If you have been drinking alcohol. Do not ride with someone who has been drinking. If you have been using any mind-altering substances or drugs. While texting. When you are tired or distracted. Wear a helmet and other protective equipment during sports activities. If you have firearms in your house, make sure you follow all gun safety procedures. Seek help if you have been physically or sexually abused. What's next? Go to your health care provider once a year for an annual wellness visit. Ask your health care provider how often you should have your eyes and teeth checked. Stay up to date on all vaccines. This information is not intended to replace advice given to you by your health care provider. Make sure you discuss any questions you have with your health care provider. Document Revised: 01/25/2021 Document Reviewed: 01/25/2021 Elsevier Patient Education  [place].

## 2022-03-18 LAB — HIV ANTIBODY (ROUTINE TESTING W REFLEX): HIV 1&2 Ab, 4th Generation: NONREACTIVE

## 2022-03-18 LAB — HEPATITIS C ANTIBODY: Hepatitis C Ab: NONREACTIVE

## 2022-03-19 ENCOUNTER — Other Ambulatory Visit: Payer: Self-pay | Admitting: Primary Care

## 2022-03-19 DIAGNOSIS — R7989 Other specified abnormal findings of blood chemistry: Secondary | ICD-10-CM

## 2022-03-23 ENCOUNTER — Other Ambulatory Visit (INDEPENDENT_AMBULATORY_CARE_PROVIDER_SITE_OTHER): Payer: BC Managed Care – PPO

## 2022-03-23 DIAGNOSIS — R7989 Other specified abnormal findings of blood chemistry: Secondary | ICD-10-CM | POA: Diagnosis not present

## 2022-03-23 LAB — T4, FREE: Free T4: 0.7 ng/dL (ref 0.60–1.60)

## 2022-03-23 LAB — TSH: TSH: 0.35 u[IU]/mL (ref 0.35–5.50)

## 2022-04-25 NOTE — Progress Notes (Signed)
Cardiology Office Note   Date:  04/26/2022   ID:  Lindsay Barnett, DOB May 29, 1996, MRN [id number]  PCP:  Doreene Nest, NP  Cardiologist:   None Referring:  Doreene Nest, NP    Chief Complaint  Patient presents with   Loss of Consciousness      History of Present Illness: Lindsay Barnett is a 26 y.o. female who presents for evaluation of syncope.  The patient said she is having history of this for presyncope over the years.   She is referred by Doreene Nest, NP she said she had her most significant episode in 2017 when she apparently was walking back from the bathroom and felt poorly and actually went out.  Her last episode where she may have actually lost consciousness was while she was at a theme park in 2022.  She was online and she felt dizzy and had to lean against friends and they had to get her out of line.  I do not think she actually hit the ground.  Her episodes are always happening when she is standing.  She says it happens not daily or weekly but probably several times a year.  She can typically recognize when it is coming on and can lie down to avoid frank syncope.  She does not necessarily feel her heart racing or skipping.  She feels hot and flushed.  She can precipitated by being in a hot environment working but it can also happen if she is just up and doing the dishes and comfortable.  She is not having any chest pressure, neck or arm discomfort.  She is not having any shortness of breath, PND or orthopnea.  Has had weight gain or edema.  She does report some borderline thyroid abnormalities.      Past Medical History:  Diagnosis Date   Abnormal Thyroid Function Testing     Past Surgical History:  Procedure Laterality Date   None       Current Outpatient Medications  Medication Sig Dispense Refill   norethindrone-ethinyl estradiol (JUNEL 1/20) 1-20 MG-MCG tablet Take 1 tablet by mouth daily. 84 tablet 3   No current facility-administered  medications for this visit.    Allergies:   Patient has no known allergies.    Social History:  The patient  reports that she has never smoked. She has never used smokeless tobacco. She reports that she does not drink alcohol and does not use drugs.   Family History:  The patient's family history includes Breast cancer in her maternal aunt; Breast cancer (age of onset: 40) in her mother; Hypertension in her mother; Lung cancer in her maternal grandfather; Narcolepsy in her brother; Prostate cancer in her father.    ROS:  Please see the history of present illness.   Otherwise, review of systems are positive for none.   All other systems are reviewed and negative.    PHYSICAL EXAM: VS:  BP 116/78   Pulse 66   Ht 5\' 5"  (1.651 m)   Wt 219 lb (99.3 kg)   SpO2 99%   BMI 36.44 kg/m  , BMI Body mass index is 36.44 kg/m. GENERAL:  Well appearing HEENT:  Pupils equal round and reactive, fundi not visualized, oral mucosa unremarkable NECK:  No jugular venous distention, waveform within normal limits, carotid upstroke brisk and symmetric, no bruits, no thyromegaly LYMPHATICS:  No cervical, inguinal adenopathy LUNGS:  Clear to auscultation bilaterally BACK:  No CVA tenderness CHEST:  Unremarkable HEART:  PMI not displaced or sustained,S1 and S2 within normal limits, no S3, no S4, no clicks, no rubs, no murmurs ABD:  Flat, positive bowel sounds normal in frequency in pitch, no bruits, no rebound, no guarding, no midline pulsatile mass, no hepatomegaly, no splenomegaly EXT:  2 plus pulses throughout, no edema, no cyanosis no clubbing SKIN:  No rashes no nodules NEURO:  Cranial nerves II through XII grossly intact, motor grossly intact throughout PSYCH:  Cognitively intact, oriented to person place and time    EKG:  EKG is ordered today. The ekg ordered today demonstrates sinus rhythm, rate 66, axis within normal limits, intervals within normal limits, no acute ST-T wave changes.   Recent  Labs: 03/16/2022: ALT 16; BUN 9; Creatinine, Ser 0.82; Hemoglobin 14.2; Platelets 310.0; Potassium 3.9; Sodium 139 03/23/2022: TSH 0.35    Lipid Panel No results found for: "CHOL", "TRIG", "HDL", "CHOLHDL", "VLDL", "LDLCALC", "LDLDIRECT"    Wt Readings from Last 3 Encounters:  04/26/22 219 lb (99.3 kg)  03/16/22 215 lb (97.5 kg)  01/31/21 220 lb (99.8 kg)      Other studies Reviewed: Additional studies/ records that were reviewed today include: Labs. Review of the above records demonstrates:  Please see elsewhere in the note.     ASSESSMENT AND PLAN:  Syncope: Patient is probably having orthostatic or vagal syncope though she was not orthostatic in the office today.  She would like to have her labs repeated because her TSH was borderline previously and so I will check a TSH T3 and T4.  I would like her to wear a 4-week monitor.  However, if this is unremarkable I am not sure that further cardiac work-up would be indicated.  Her physical exam is unremarkable and I do not strongly suspect structural heart disease.  Other etiologies could be neurologic versus vertiginous which I think less likely.      Current medicines are reviewed at length with the patient today.  The patient does not have concerns regarding medicines.  The following changes have been made:  no change  Labs/ tests ordered today include: None  Orders Placed This Encounter  Procedures   TSH   CARDIAC EVENT MONITOR   EKG 12-Lead     Disposition:   FU with me as needed and based on the results of the monitor.   Signed, Rollene Rotunda, MD  04/26/2022 10:58 AM    [facility]

## 2022-04-26 ENCOUNTER — Ambulatory Visit: Payer: BC Managed Care – PPO | Attending: Cardiology | Admitting: Cardiology

## 2022-04-26 ENCOUNTER — Encounter: Payer: Self-pay | Admitting: Cardiology

## 2022-04-26 VITALS — BP 116/78 | HR 66 | Ht 65.0 in | Wt 219.0 lb

## 2022-04-26 DIAGNOSIS — R55 Syncope and collapse: Secondary | ICD-10-CM

## 2022-04-26 NOTE — Patient Instructions (Addendum)
Medication Instructions:  The current medical regimen is effective;  continue present plan and medications.  *If you need a refill on your cardiac medications before your next appointment, please call your pharmacy*   Testing/Procedures: Your physician has recommended that you wear an event monitor. Event monitors are medical devices that record the heart's electrical activity. Doctors most often [place] these monitors to diagnose arrhythmias. Arrhythmias are problems with the speed or rhythm of the heartbeat. The monitor is a small, portable device. You can wear one while you do your normal daily activities. This is usually used to diagnose what is causing palpitations/syncope (passing out).    Follow-Up: At [facility], you and your health needs are our priority.  As part of our continuing mission to provide you with exceptional heart care, we have created designated Provider Care Teams.  These Care Teams include your primary Cardiologist (physician) and Advanced Practice Providers (APPs -  Physician Assistants and Nurse Practitioners) who all work together to provide you with the care you need, when you need it.  We recommend signing up for the patient portal called "MyChart".  Sign up information is provided on this After Visit Summary.  MyChart is used to connect with patients for Virtual Visits (Telemedicine).  Patients are able to view lab/test results, encounter notes, upcoming appointments, etc.  Non-urgent messages can be sent to your provider as well.   To learn more about what you can do with MyChart, go to [email or web address].    Your next appointment:   As needed  The format for your next appointment:   In Person  Provider:   Rollene Rotunda, MD

## 2022-04-27 LAB — TSH: TSH: 0.354 u[IU]/mL — ABNORMAL LOW (ref 0.450–4.500)

## 2022-05-08 ENCOUNTER — Ambulatory Visit: Payer: BC Managed Care – PPO | Attending: Cardiology

## 2022-05-08 DIAGNOSIS — R55 Syncope and collapse: Secondary | ICD-10-CM

## 2022-06-20 ENCOUNTER — Encounter: Payer: Self-pay | Admitting: *Deleted

## 2022-07-10 ENCOUNTER — Ambulatory Visit: Payer: BC Managed Care – PPO | Admitting: Primary Care

## 2022-07-10 ENCOUNTER — Encounter: Payer: Self-pay | Admitting: Primary Care

## 2022-07-10 VITALS — BP 118/76 | HR 83 | Temp 99.0°F | Ht 65.0 in | Wt 213.0 lb

## 2022-07-10 DIAGNOSIS — M545 Low back pain, unspecified: Secondary | ICD-10-CM

## 2022-07-10 DIAGNOSIS — M2550 Pain in unspecified joint: Secondary | ICD-10-CM | POA: Diagnosis not present

## 2022-07-10 DIAGNOSIS — R202 Paresthesia of skin: Secondary | ICD-10-CM | POA: Insufficient documentation

## 2022-07-10 DIAGNOSIS — M542 Cervicalgia: Secondary | ICD-10-CM | POA: Diagnosis not present

## 2022-07-10 DIAGNOSIS — G8929 Other chronic pain: Secondary | ICD-10-CM

## 2022-07-10 HISTORY — DX: Other chronic pain: G89.29

## 2022-07-10 HISTORY — DX: Paresthesia of skin: R20.2

## 2022-07-10 NOTE — Progress Notes (Signed)
Subjective:    Patient ID: Lindsay Barnett, female    DOB: Nov 18, 1995, 26 y.o.   MRN: [id number]  HPI  Lindsay Barnett is a very pleasant 26 y.o. female who presents today to discuss joint pain.   History of daily, chronic neck and lower back pain for years. Over the last three weeks she developed intermittent left hip pain which then radiated to the left knee, then down to her left lateral dorsal foot. She's also developed intermittent bilateral joint pain to the hands, numbness to the hands.   Her newer symptoms typically occur while at work. 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 Today her symptoms include pain to the joints of the left hand and left dorsal foot pain. She has a family history of joint pain and nerve pain in her mother. She doesn't recall the names of her mother's condition.  She denies diplopia, headaches, weakness to upper or lower extremities, numbness to lower extremities, pain from her neck with radiation to her upper extremities, numbness from the shoulder to fingers. She has never been treated for her neck or back pain. She's recently been taking Tylenol Arthritis without improvement .   Review of Systems  Musculoskeletal:  Positive for arthralgias, back pain and neck pain.  Neurological:  Positive for numbness. Negative for headaches.         Past Medical History:  Diagnosis Date   Abnormal Thyroid Function Testing     Social History   Socioeconomic History   Marital status: Single    Spouse name: Not on file   Number of children: Not on file   Years of education: Not on file   Highest education level: Not on file  Occupational History   Not on file  Tobacco Use   Smoking status: Never   Smokeless tobacco: Never  Substance and Sexual Activity    Alcohol use: No    Alcohol/week: 0.0 standard drinks of alcohol   Drug use: No   Sexual activity: Not on file  Other Topics Concern   Not on file  Social History Narrative   Single.   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   Enjoys playing on the computer, cooking.    Social Determinants of Health   Financial Resource Strain: Not on file  Food Insecurity: Not on file  Transportation Needs: Not on file  Physical Activity: Not on file  Stress: Not on file  Social Connections: Not on file  Intimate Partner Violence: Not on file    Past Surgical History:  Procedure Laterality Date   None      Family History  Problem Relation Age of Onset   Hypertension Mother    Breast cancer Mother 63   Prostate cancer Father    Narcolepsy Brother    Lung cancer Maternal Grandfather    Breast cancer Maternal Aunt        60's    No Known Allergies  Current Outpatient Medications on File Prior to Visit  Medication Sig Dispense Refill   norethindrone-ethinyl estradiol (JUNEL 1/20) 1-20 MG-MCG tablet Take 1 tablet by mouth daily. 84 tablet 3   No current facility-administered medications on file prior to visit.    BP 118/76   Pulse 83   Temp 99 F (  37.2 C) (Temporal)   Ht 5\' 5"  (1.651 m)   Wt 213 lb (96.6 kg)   SpO2 99%   BMI 35.45 kg/m  Objective:   Physical Exam Musculoskeletal:     Cervical back: Normal range of motion. No tenderness. Normal range of motion.     Lumbar back: No bony tenderness. Negative right straight leg raise test and negative left straight leg raise test.       Back:  Neurological:     Mental Status: She is alert.     Comments: Negative Tinel's and Phalen's signs.           Assessment & Plan:   Problem List Items Addressed This Visit       Other   Chronic joint pain - Primary    Checking labs today to evaluate for autoimmune arthritis. Await results.       Relevant Orders   Ambulatory referral to Physical Therapy    Cyclic citrul peptide antibody, IgG   C-reactive protein   Rheumatoid factor   Sedimentation rate   Uric acid   Chronic neck pain    Referral placed for physical therapy.      Relevant Orders   Ambulatory referral to Physical Therapy   Paresthesias    Symptoms are more representative of carpal tunnel, especially since they are provided with movement at work. Keep MS in differential diagnoses. Consider CT if warranted.  Checking vitamin B12 and CBC Also evaluating for autoimmune arthritis.       Relevant Orders   Vitamin B12   CBC   Chronic bilateral low back pain without sciatica    Referral placed for PT.       Relevant Orders   Ambulatory referral to Physical Therapy       , NP

## 2022-07-10 NOTE — Patient Instructions (Signed)
Stop by the lab prior to leaving today. I will notify you of your results once received.   You will either be contacted via phone regarding your referral to physical therapy, or you may receive a letter on your MyChart portal from our referral team with instructions for scheduling an appointment. Please let us know if you have not been contacted by anyone within two weeks.  It was a pleasure to see you today!

## 2022-07-10 NOTE — Assessment & Plan Note (Signed)
Referral placed for physical therapy

## 2022-07-10 NOTE — Assessment & Plan Note (Addendum)
Symptoms are more representative of carpal tunnel, especially since they are provided with movement at work. Keep MS in differential diagnoses. Consider CT if warranted.  Checking vitamin B12 and CBC Also evaluating for autoimmune arthritis.

## 2022-07-10 NOTE — Assessment & Plan Note (Signed)
Checking labs today to evaluate for autoimmune arthritis. Await results.

## 2022-07-10 NOTE — Assessment & Plan Note (Signed)
Referral placed for PT

## 2022-07-11 LAB — C-REACTIVE PROTEIN: CRP: <1 mg/dL (ref 0.5–20.0)

## 2022-07-11 LAB — CBC
HCT: 39.9 % (ref 36.0–46.0)
Hemoglobin: 13.9 g/dL (ref 12.0–15.0)
MCHC: 34.8 g/dL (ref 30.0–36.0)
MCV: 90.3 fl (ref 78.0–100.0)
Platelets: 341 10*3/uL (ref 150.0–400.0)
RBC: 4.42 Mil/uL (ref 3.87–5.11)
RDW: 13.5 % (ref 11.5–15.5)
WBC: 6.3 10*3/uL (ref 4.0–10.5)

## 2022-07-11 LAB — VITAMIN B12: Vitamin B-12: 255 pg/mL (ref 211–911)

## 2022-07-11 LAB — URIC ACID: Uric Acid, Serum: 2.9 mg/dL (ref 2.4–7.0)

## 2022-07-11 LAB — SEDIMENTATION RATE: Sed Rate: 1 mm/hr (ref 0–20)

## 2022-07-12 LAB — RHEUMATOID FACTOR: Rheumatoid fact SerPl-aCnc: <14 IU/mL (ref <14)

## 2022-07-12 LAB — CYCLIC CITRUL PEPTIDE ANTIBODY, IGG: Cyclic Citrullin Peptide Ab: <16 UNITS

## 2022-07-17 ENCOUNTER — Ambulatory Visit: Payer: BC Managed Care – PPO | Admitting: Primary Care

## 2022-08-13 NOTE — L&D Delivery Note (Cosign Needed Addendum)
OB/GYN Faculty Practice Delivery Note  Lindsay Barnett is a 27 y.o. G1P0 s/p SVD at [redacted]w[redacted]d. She was admitted for IOL secondary to abnormal BPP.   ROM: 6h 60m with clear fluid GBS Status:  Negative/-- (11/26 0239) Maximum Maternal Temperature: Temp (24hrs), Avg:98.4 F (36.9 C), Min:98.2 F (36.8 C), Max:98.8 F (37.1 C)   Labor Progress: Received Cytotec on arrival. Initial SVE at 2156: 1.5/50/-3. AROM at 0900. She then progressed to complete.   Delivery Date/Time: 07/30/2023 at 1535  Delivery: Called to room and patient was complete and pushing. Head delivered spontaneously in direct OA. No nuchal cord present. Shoulder and body delivered in usual fashion. Infant with spontaneous cry, placed on mother's abdomen, dried and stimulated. Cord clamped x 2 after 1-minute delay, and cut by father of baby. Cord blood drawn. Placenta delivered spontaneously with gentle cord traction. Fundus firm with massage and Pitocin. Labia, perineum, vagina, and cervix inspected with a first degree perineal laceration noted, which was repaired in the usual fashion.  Baby Weight: pending  Placenta: 3 vessel, intact. Sent to L&D Complications: None Lacerations: 1st degree repaired in usual fashion EBL: 58 mL Analgesia: Epidural   Infant:  APGAR (1 MIN): 8  APGAR (5 MINS): 9   Madelyn Brunner, MD  PGY-1 07/30/2023, 3:56 PM  Attestation of Supervision of Resident:  I confirm that I have verified the information documented in the resident's note and that I was gloved and present for the delivery, I performed the laceration repair.  I have verified that all services and findings are accurately documented in this resident's note; and I agree with management and plan as outlined in the documentation. I have also made any necessary editorial changes.  Sundra Aland, MD OB Fellow, Faculty Practice [facility], Center for [facility]

## 2022-08-16 ENCOUNTER — Telehealth: Payer: BC Managed Care – PPO | Admitting: Primary Care

## 2022-09-18 IMAGING — US US AXILLARY RIGHT
1 series · 5 of 5 positions shown · non-contrast
Comparison: None.

CLINICAL DATA: Patient presents for a diagnostic right axillary
ultrasound due to a palpable abnormality initially noticed 1 month
ago over the right axilla. Patient went to her healthcare provider
feel this palpable abnormality today.

EXAM:
ULTRASOUND OF THE RIGHT AXILLA

[Series 1: us axillary right · 0.07mm/px · 5 of 5 slices shown]
[im 1/5]
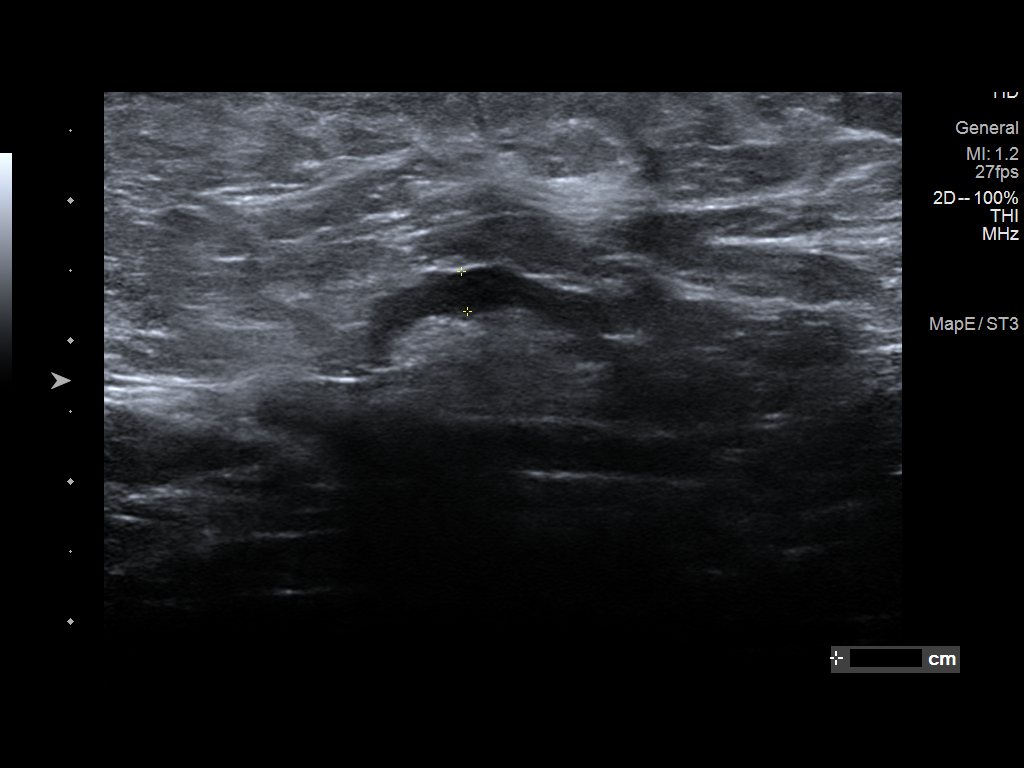
[im 2/5]
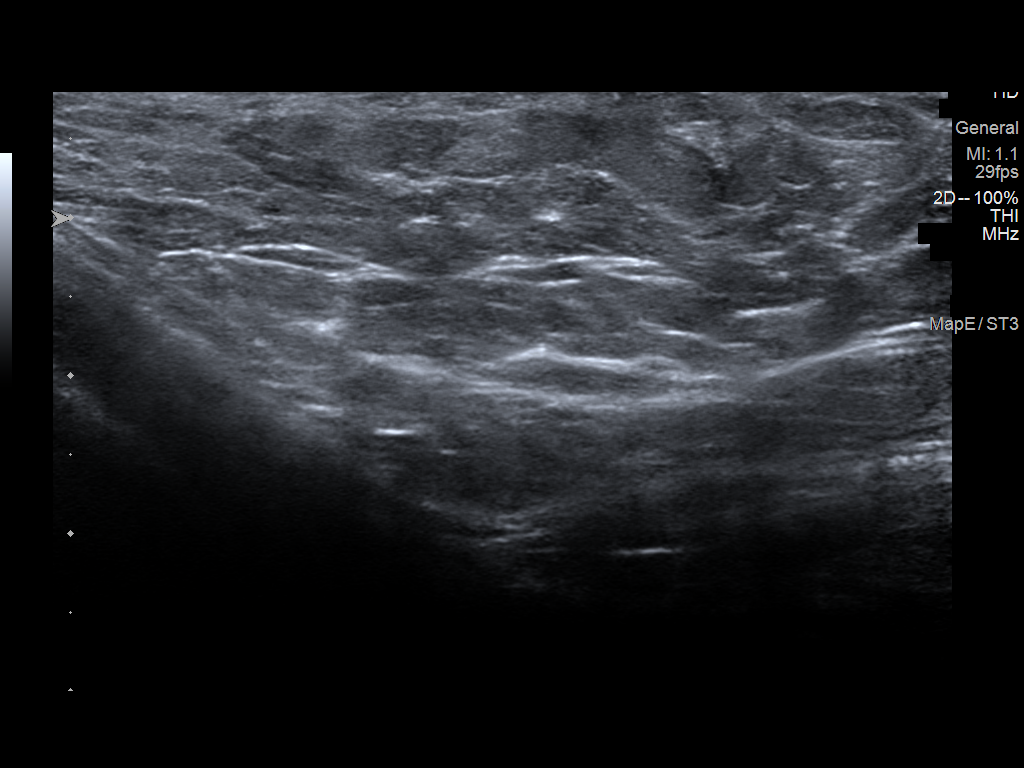
[im 3/5]
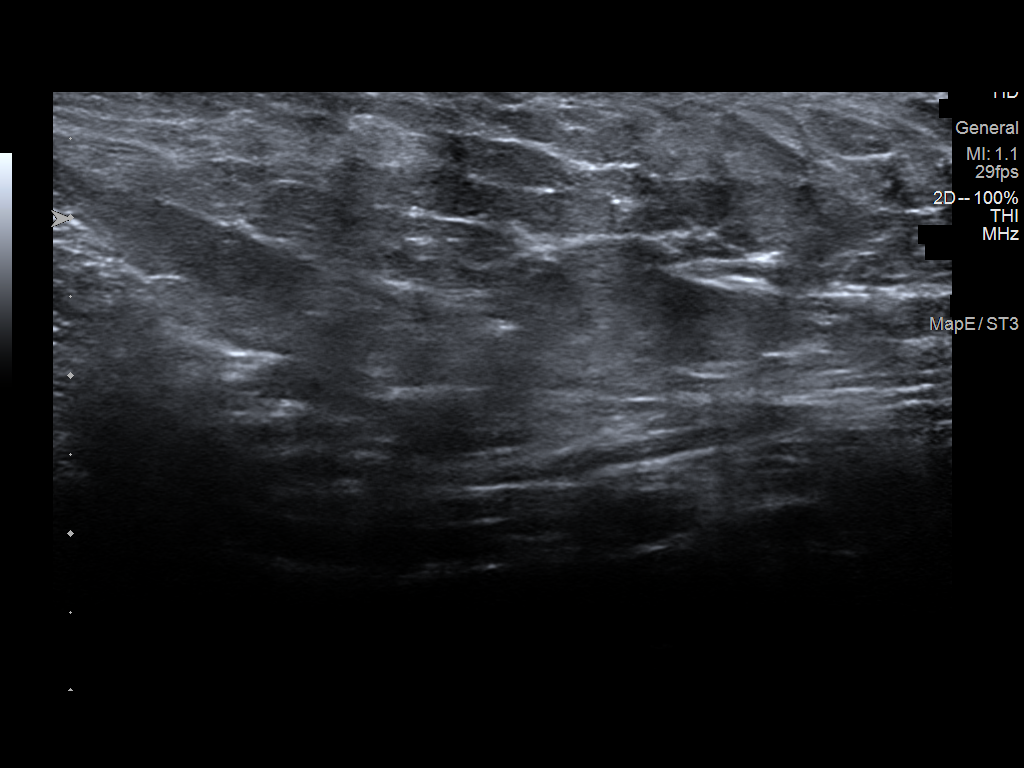
[im 4/5]
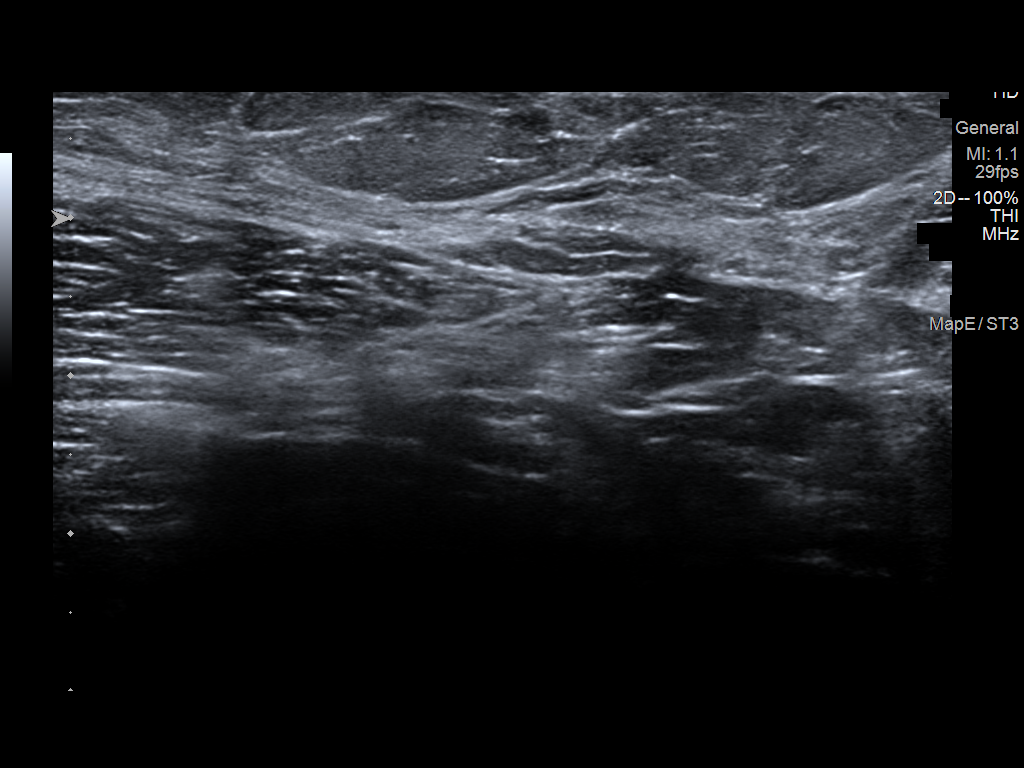
[im 5/5]
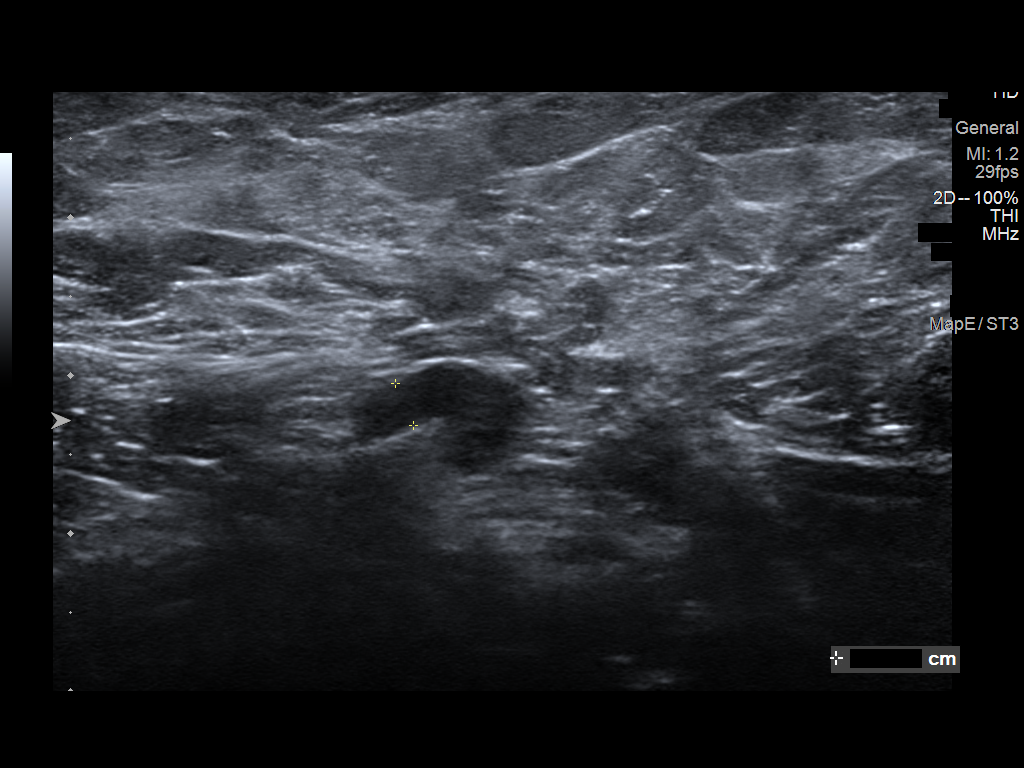

[5 of 5 positions shown; findings below may reference images not displayed]

FINDINGS: Ultrasound is performed, showing no focal abnormality over the right
axilla to account for patient's recent palpable abnormality. Several
normal axillary lymph nodes are visualized.
IMPRESSION: No focal abnormality over the right axilla to account for patient's
recent palpable abnormality.

RECOMMENDATION:
Recommend self examination and to return for re-evaluation if this
abnormality worsens. Otherwise, recommend beginning annual screening
mammography at age 40.

I have discussed the findings and recommendations with the patient.
If applicable, a reminder letter will be sent to the patient
regarding the next appointment.

BI-RADS CATEGORY  1: Negative.

## 2022-09-19 DIAGNOSIS — Z20822 Contact with and (suspected) exposure to covid-19: Secondary | ICD-10-CM | POA: Diagnosis not present

## 2022-09-19 DIAGNOSIS — R051 Acute cough: Secondary | ICD-10-CM | POA: Diagnosis not present

## 2022-09-19 DIAGNOSIS — R079 Chest pain, unspecified: Secondary | ICD-10-CM | POA: Diagnosis not present

## 2022-09-20 ENCOUNTER — Encounter: Payer: Self-pay | Admitting: Primary Care

## 2022-09-20 ENCOUNTER — Ambulatory Visit: Payer: BC Managed Care – PPO | Admitting: Primary Care

## 2022-09-20 ENCOUNTER — Ambulatory Visit (INDEPENDENT_AMBULATORY_CARE_PROVIDER_SITE_OTHER)
Admission: RE | Admit: 2022-09-20 | Discharge: 2022-09-20 | Disposition: A | Discharge status: Home / Self Care | Payer: BC Managed Care – PPO | Source: Ambulatory Visit | Attending: Primary Care | Admitting: Primary Care

## 2022-09-20 VITALS — BP 138/82 | HR 79 | Temp 98.4°F | Ht 65.0 in | Wt 203.0 lb

## 2022-09-20 DIAGNOSIS — R079 Chest pain, unspecified: Secondary | ICD-10-CM | POA: Diagnosis not present

## 2022-09-20 DIAGNOSIS — R0602 Shortness of breath: Secondary | ICD-10-CM | POA: Diagnosis not present

## 2022-09-20 DIAGNOSIS — R0789 Other chest pain: Secondary | ICD-10-CM | POA: Diagnosis not present

## 2022-09-20 LAB — CBC WITH DIFFERENTIAL/PLATELET
Basophils Absolute: 0 10*3/uL (ref 0.0–0.1)
Basophils Relative: 0.5 % (ref 0.0–3.0)
Eosinophils Absolute: 0 10*3/uL (ref 0.0–0.7)
Eosinophils Relative: 1.2 % (ref 0.0–5.0)
HCT: 40.3 % (ref 36.0–46.0)
Hemoglobin: 14 g/dL (ref 12.0–15.0)
Lymphocytes Relative: 31.2 % (ref 12.0–46.0)
Lymphs Abs: 1 10*3/uL (ref 0.7–4.0)
MCHC: 34.8 g/dL (ref 30.0–36.0)
MCV: 89.6 fl (ref 78.0–100.0)
Monocytes Absolute: 0.4 10*3/uL (ref 0.1–1.0)
Monocytes Relative: 13.3 % — ABNORMAL HIGH (ref 3.0–12.0)
Neutro Abs: 1.7 10*3/uL (ref 1.4–7.7)
Neutrophils Relative %: 53.8 % (ref 43.0–77.0)
Platelets: 249 10*3/uL (ref 150.0–400.0)
RBC: 4.5 Mil/uL (ref 3.87–5.11)
RDW: 13.5 % (ref 11.5–15.5)
WBC: 3.2 10*3/uL — ABNORMAL LOW (ref 4.0–10.5)

## 2022-09-20 MED ORDER — PREDNISONE 20 MG PO TABS: ORAL_TABLET | ORAL | 0 refills | Status: DC

## 2022-09-20 NOTE — Patient Instructions (Signed)
Complete xray(s) and labs prior to leaving today. I will notify you of your results once received.  Start prednisone 20 mg tablets. Take 2 tablets by mouth once daily in the morning for 5 days.  It was a pleasure to see you today!

## 2022-09-20 NOTE — Assessment & Plan Note (Signed)
Do not suspect cardiac cause. Differentials include asthma, GERD, pneumonia. Need to rule out PE, especially since she's on OCP's.  Chest xray ordered and pending. D-dimer ordered and pending.  Start prednisone 20 mg tablets. Take 2 tablets by mouth once daily in the morning for 5 days.  Await results.   Reviewed Urgent Care notes and ECG.

## 2022-09-20 NOTE — Progress Notes (Signed)
Subjective:    Patient ID: Lindsay Barnett, female    DOB: [birth year], 27 y.o.   MRN: [id number]  HPI  Lindsay Barnett is a very pleasant 27 y.o. female with a history of asthma and GERD who presents today for urgent care follow up.  She presented to [facility] yesterday for a 3 day history of central chest pain without radiation for which she described as a "pressure". She also mentioned congestion, rhinorrhea, wheezing x 3 days. No improvement with Mucinex.   During her visit she tested negative for Covid-19 infection and influenza. ECG was without acute changes. She was treated with an albuterol nebulizer with improvement. She was prescribed an albuterol inhaler. She was advised to follow up with PCP.  Today she continues to experience central chest pressure with shortness of breath and chest tightness. Symptoms are worse with deep inhalation and she feels shortness of breath with mild exertion. She's used her albuterol inhaler a few times since yesterday and hasn't noticed much improvement.   She continues with a mild cough, chills, and runny nose, overall feels better.   She is managed on birth control pills. She has a history of asthma as a child, no trouble as a teenager or adult.   She denies esophageal burning, throat fullness, fevers, body aches.     Review of Systems  Constitutional:  Positive for chills and fatigue. Negative for fever.  HENT:  Positive for rhinorrhea.   Respiratory:  Positive for cough, chest tightness, shortness of breath and wheezing.   Cardiovascular:  Positive for chest pain.  Neurological:  Negative for headaches.         Past Medical History:  Diagnosis Date   Abnormal Thyroid Function Testing     Social History   Socioeconomic History   Marital status: Single    Spouse name: Not on file   Number of children: Not on file   Years of education: Not on file   Highest education level: Not on file  Occupational History   Not  on file  Tobacco Use   Smoking status: Never   Smokeless tobacco: Never  Substance and Sexual Activity   Alcohol use: No    Alcohol/week: 0.0 standard drinks of alcohol   Drug use: No   Sexual activity: Not on file  Other Topics Concern   Not on file  Social History Narrative   Single.   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   Enjoys playing on the computer, cooking.    Social Determinants of Health   Financial Resource Strain: Not on file  Food Insecurity: Not on file  Transportation Needs: Not on file  Physical Activity: Not on file  Stress: Not on file  Social Connections: Not on file  Intimate Partner Violence: Not on file    Past Surgical History:  Procedure Laterality Date   None      Family History  Problem Relation Age of Onset   Hypertension Mother    Breast cancer Mother 43   Prostate cancer Father    Narcolepsy Brother    Lung cancer Maternal Grandfather    Breast cancer Maternal Aunt        60's    No Known Allergies  Current Outpatient Medications on File Prior to Visit  Medication Sig Dispense Refill   norethindrone-ethinyl estradiol (JUNEL 1/20) 1-20 MG-MCG tablet Take 1 tablet by mouth daily. 84 tablet 3   No current facility-administered medications on file  prior to visit.    BP 138/82   Pulse 79   Temp 98.4 F (36.9 C) (Temporal)   Ht 5\' 5"  (1.651 m)   Wt 203 lb (92.1 kg)   SpO2 100%   BMI 33.78 kg/m  Objective:   Physical Exam HENT:     Right Ear: Tympanic membrane and ear canal normal.     Left Ear: Tympanic membrane and ear canal normal.     Nose:     Right Sinus: No maxillary sinus tenderness or frontal sinus tenderness.     Left Sinus: No maxillary sinus tenderness or frontal sinus tenderness.     Mouth/Throat:     Pharynx: No posterior oropharyngeal erythema.  Eyes:     Conjunctiva/sclera: Conjunctivae normal.  Cardiovascular:     Rate and Rhythm: Normal rate and regular rhythm.  Pulmonary:      Effort: Pulmonary effort is normal.     Breath sounds: Normal breath sounds. No wheezing or rales.  Musculoskeletal:     Cervical back: Neck supple.  Lymphadenopathy:     Cervical: No cervical adenopathy.  Skin:    General: Skin is warm and dry.           Assessment & Plan:  Chest pain, unspecified type Assessment & Plan: Do not suspect cardiac cause. Differentials include asthma, GERD, pneumonia. Need to rule out PE, especially since she's on OCP's.  Chest xray ordered and pending. D-dimer ordered and pending.  Start prednisone 20 mg tablets. Take 2 tablets by mouth once daily in the morning for 5 days.  Await results.   Reviewed Urgent Care notes and ECG.  Orders: -     DG Chest 2 View -     D-dimer, quantitative -     CBC with Differential/Platelet -     predniSONE; Take 2 tablets by mouth once daily in the morning for 5 days.  Dispense: 10 tablet; Refill: 0        Pleas Koch, NP

## 2022-09-21 ENCOUNTER — Telehealth: Payer: Self-pay

## 2022-09-21 ENCOUNTER — Ambulatory Visit
Admission: RE | Admit: 2022-09-21 | Discharge: 2022-09-21 | Disposition: A | Discharge status: Home / Self Care | Payer: BC Managed Care – PPO | Source: Ambulatory Visit | Attending: Primary Care | Admitting: Primary Care

## 2022-09-21 ENCOUNTER — Other Ambulatory Visit: Payer: Self-pay | Admitting: Primary Care

## 2022-09-21 DIAGNOSIS — R051 Acute cough: Secondary | ICD-10-CM

## 2022-09-21 DIAGNOSIS — R079 Chest pain, unspecified: Secondary | ICD-10-CM

## 2022-09-21 DIAGNOSIS — R0602 Shortness of breath: Secondary | ICD-10-CM | POA: Diagnosis not present

## 2022-09-21 DIAGNOSIS — R7989 Other specified abnormal findings of blood chemistry: Secondary | ICD-10-CM

## 2022-09-21 DIAGNOSIS — R918 Other nonspecific abnormal finding of lung field: Secondary | ICD-10-CM | POA: Diagnosis not present

## 2022-09-21 LAB — D-DIMER, QUANTITATIVE: D-Dimer, Quant: 1.02 mcg/mL FEU — ABNORMAL HIGH (ref <0.50)

## 2022-09-21 MED ORDER — IOPAMIDOL (ISOVUE-370) INJECTION 76%
75.0000 mL | Freq: Once | INTRAVENOUS | Status: AC | PRN
  Administered 2022-09-21: 75 mL via INTRAVENOUS

## 2022-09-21 MED ORDER — AMOXICILLIN-POT CLAVULANATE 875-125 MG PO TABS: 1.0000 | ORAL_TABLET | Freq: Two times a day (BID) | ORAL | 0 refills | Status: DC

## 2022-09-21 NOTE — Telephone Encounter (Signed)
Lindsay Barnett with [facility] called critical lab on d dimer 1.02. sending to Gentry Fitz NP,Kelli CMA and will teams [facility]. Also noted in lab notebook.

## 2022-09-21 NOTE — Telephone Encounter (Signed)
See result note.  

## 2022-12-06 ENCOUNTER — Ambulatory Visit: Payer: BC Managed Care – PPO | Admitting: Primary Care

## 2022-12-06 ENCOUNTER — Encounter: Payer: Self-pay | Admitting: Primary Care

## 2022-12-06 VITALS — BP 122/64 | HR 81 | Temp 98.2°F | Ht 65.0 in | Wt 204.0 lb

## 2022-12-06 DIAGNOSIS — Z3201 Encounter for pregnancy test, result positive: Secondary | ICD-10-CM

## 2022-12-06 LAB — HCG, QUANTITATIVE, PREGNANCY: Quantitative HCG: 15980 m[IU]/mL

## 2022-12-06 NOTE — Progress Notes (Signed)
Subjective:    Patient ID: Lindsay Barnett, female    DOB: 1996/02/10, 27 y.o.   MRN: [id number]  HPI  Lindsay Barnett is a very pleasant 27 y.o. female who presents today for pregnancy confirmation.  LMP was sometime in late February or late March 2024.   She has taken four pregnancy tests from 04/20 through 04/30, and all four have been positive. She denies vaginal bleeding and spotting, pelvic pain. She has noticed breast tenderness and nausea, breast enlargement.   She is here for pregnancy confirmation and a referral to an OB/GYN. She stopped her birth control pills in February 2024.   Review of Systems  Gastrointestinal:  Positive for constipation and nausea. Negative for abdominal pain and vomiting.  Genitourinary:  Negative for pelvic pain, vaginal bleeding and vaginal pain.       Breast tenderness         Past Medical History:  Diagnosis Date   Abnormal Thyroid Function Testing     Social History   Socioeconomic History   Marital status: Single    Spouse name: Not on file   Number of children: Not on file   Years of education: Not on file   Highest education level: Not on file  Occupational History   Not on file  Tobacco Use   Smoking status: Never   Smokeless tobacco: Never  Substance and Sexual Activity   Alcohol use: No    Alcohol/week: 0.0 standard drinks of alcohol   Drug use: No   Sexual activity: Not on file  Other Topics Concern   Not on file  Social History Narrative   Single.   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   Enjoys playing on the computer, cooking.    Social Determinants of Health   Financial Resource Strain: Not on file  Food Insecurity: Not on file  Transportation Needs: Not on file  Physical Activity: Not on file  Stress: Not on file  Social Connections: Not on file  Intimate Partner Violence: Not on file    Past Surgical History:  Procedure Laterality Date   None      Family History  Problem  Relation Age of Onset   Hypertension Mother    Breast cancer Mother 21   Prostate cancer Father    Narcolepsy Brother    Lung cancer Maternal Grandfather    Breast cancer Maternal Aunt        60's    No Known Allergies  No current outpatient medications on file prior to visit.   No current facility-administered medications on file prior to visit.    BP 122/64   Pulse 81   Temp 98.2 F (36.8 C) (Temporal)   Ht  (1.651 m)   Wt 204 lb (92.5 kg)   LMP 10/10/2022 (Approximate)   SpO2 100%   BMI 33.95 kg/m  Objective:   Physical Exam Cardiovascular:     Rate and Rhythm: Normal rate and regular rhythm.  Pulmonary:     Effort: Pulmonary effort is normal.     Breath sounds: Normal breath sounds.  Abdominal:     Palpations: Abdomen is soft.     Tenderness: There is no abdominal tenderness.  Musculoskeletal:     Cervical back: Neck supple.  Skin:    General: Skin is warm and dry.           Assessment & Plan:  Positive pregnancy test Assessment & Plan: HCG quantitative ordered and pending.  We also discussed prenatal care including vitamins, foods to avoid, and alarm signs for miscarriage/ectopic pregnancy.  Referral placed to OB/GYN.    Orders: -     Ambulatory referral to Obstetrics / Gynecology -     hCG, quantitative, pregnancy        Doreene Nest, NP

## 2022-12-06 NOTE — Patient Instructions (Signed)
Start a prenatal vitamin daily.  You will either be contacted via phone regarding your referral to OB/GYN, or you may receive a letter on your MyChart portal from our referral team with instructions for scheduling an appointment. Please let us know if you have not been contacted by anyone within two weeks.  Stop by the lab prior to leaving today. I will notify you of your results once received.   It was a pleasure to see you today!

## 2022-12-06 NOTE — Assessment & Plan Note (Signed)
HCG quantitative ordered and pending.  We also discussed prenatal care including vitamins, foods to avoid, and alarm signs for miscarriage/ectopic pregnancy.  Referral placed to OB/GYN.

## 2022-12-12 ENCOUNTER — Inpatient Hospital Stay: Payer: BC Managed Care – PPO | Attending: Genetic Counselor | Admitting: Genetic Counselor

## 2022-12-12 ENCOUNTER — Inpatient Hospital Stay: Payer: BC Managed Care – PPO

## 2022-12-12 ENCOUNTER — Other Ambulatory Visit (HOSPITAL_BASED_OUTPATIENT_CLINIC_OR_DEPARTMENT_OTHER): Payer: Self-pay | Admitting: Obstetrics & Gynecology

## 2022-12-12 ENCOUNTER — Other Ambulatory Visit: Payer: Self-pay

## 2022-12-12 DIAGNOSIS — Z803 Family history of malignant neoplasm of breast: Secondary | ICD-10-CM | POA: Diagnosis not present

## 2022-12-12 DIAGNOSIS — Z8042 Family history of malignant neoplasm of prostate: Secondary | ICD-10-CM

## 2022-12-12 DIAGNOSIS — Z8481 Family history of carrier of genetic disease: Secondary | ICD-10-CM

## 2022-12-12 DIAGNOSIS — O3680X0 Pregnancy with inconclusive fetal viability, not applicable or unspecified: Secondary | ICD-10-CM

## 2022-12-12 LAB — GENETIC SCREENING ORDER

## 2022-12-12 NOTE — Progress Notes (Unsigned)
REFERRING PROVIDER: Doreene Nest, NP [address] [place],  [place] [address]  PRIMARY PROVIDER:  Doreene Nest, NP  PRIMARY REASON FOR VISIT:  No diagnosis found.   HISTORY OF PRESENT ILLNESS:   Ms. Hyser, a 27 y.o. female, was seen for a [facility] cancer genetics consultation at the request of Dr. Chestine Spore due to a {Personal/family:20331} history of {cancer/polyps}.  Ms. Gavina presents to clinic today to discuss the possibility of a hereditary predisposition to cancer, to discuss genetic testing, and to further clarify her future cancer risks, as well as potential cancer risks for family members.   In ***, at the age of ***, Ms. Sanguinetti was diagnosed with {CA PATHOLOGY:63853} of the {right left (wildcard):15202} {CA DGUYQ:03474}. The treatment plan ***.    *** Ms. Garbett is a 27 y.o. female with no personal history of cancer.    CANCER HISTORY:  Oncology History   No history exists.     RISK FACTORS:  Menarche was at age ***.  First live birth at age ***.  OCP use for approximately {Numbers 1-12 multi-select:20307} years.  Ovaries intact: {Yes/No-Ex:120004}.  Uterus intact: {Yes/No-Ex:120004}.  Menopausal status: {Menopause:31378}.  HRT use: {Numbers 1-12 multi-select:20307} years. Colonoscopy: {Yes/No-Ex:120004}; {normal/abnormal/not examined:14677}. Mammogram within the last year: {Yes/No-Ex:120004}. Number of breast biopsies: {Numbers 1-12 multi-select:20307}. Up to date with pelvic exams: {Yes/No-Ex:120004}. Any excessive radiation exposure in the past: {Yes/No-Ex:120004}  Past Medical History:  Diagnosis Date   Abnormal Thyroid Function Testing     Past Surgical History:  Procedure Laterality Date   None      Social History   Socioeconomic History   Marital status: Single    Spouse name: Not on file   Number of children: Not on file   Years of education: Not on file   Highest education level: Not on file  Occupational History   Not on  file  Tobacco Use   Smoking status: Never   Smokeless tobacco: Never  Substance and Sexual Activity   Alcohol use: No    Alcohol/week: 0.0 standard drinks of alcohol   Drug use: No   Sexual activity: Not on file  Other Topics Concern   Not on file  Social History Narrative   Single.   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   Enjoys playing on the computer, cooking.    Social Determinants of Health   Financial Resource Strain: Not on file  Food Insecurity: Not on file  Transportation Needs: Not on file  Physical Activity: Not on file  Stress: Not on file  Social Connections: Not on file     FAMILY HISTORY:  We obtained a detailed, 4-generation family history.  Significant diagnoses are listed below: ***   Ms. Simeone is {aware/unaware} of previous family history of genetic testing for hereditary cancer risks. Patient's maternal ancestors are of *** descent, and paternal ancestors are of *** descent. There {IS NO:12509} reported Ashkenazi Jewish ancestry. There {IS NO:12509} known consanguinity.  GENETIC COUNSELING ASSESSMENT: Ms. Muska is a 27 y.o. female with a family history of a PALB2 gene mutation. Therefore, we discussed and recommended the following at today's visit.   DISCUSSION:  *** We reviewed the cancer risks and management recommendations for individuals who have a PALB2 gene mutation as noted below.  Clinical Information: The cancers associated with PALB2 are:   Female breast cancer, 41-60% risk Contralateral breast cancer, 10-year risk is 5-8% Ovarian cancer, 3-5% risk Pancreatic cancer, 2-5% risk Female breast cancer,  0.9%     Management Recommendations:   Breast Screening/Risk Reduction:   Women: Breast cancer screening includes: Breast awareness beginning at age 26 Monthly self-breast examination beginning at age 69 Annual clinical breast examinations beginning at age 42 Annual mammogram beginning at age 88  Annual breast MRI  with and without contrast beginning at age 31 Discuss the option of a prophylactic risk-reducing mastectomy For women who are treated for breast cancer and have not had bilateral mastectomy, screening should continue as described   Males: Consider breast cancer screening such as patient breast awareness education and annual clinical breast exams starting at age 2   Gynecological Cancer Screening/Risk Reduction: Consider a prophylactic bilateral salpingo-oophorectomy (BSO) at age 5-50   Pancreatic Cancer Screening/Risk Reduction: Avoid smoking, heavy alcohol use, and obesity. Pancreatic cancer screening in patients with a first- or second-degree relative affected with pancreatic cancer. Ideally, screening should be performed in experienced centers utilizing a multidisciplinary approach under research conditions. Recommended screening includes annual endoscopic ultrasound and/or MRI of the pancreas starting at age 10 or 61 years younger than the earliest age of pancreatic cancer diagnosis in the family.   Additional Considerations: Children who inherit two PALB2 mutations (one from each parent) have a rare form of Fanconi anemia. Fanconi anemia is characterized by bone marrow failure with variable additional anomalies, which often include short stature, abnormal skin pigmentation, abnormal thumbs, malformations of the skeletal and central nervous systems, and developmental delay. Genetic testing for a partner of an individual with a PALB2 mutation may be appropriate for reproductive decision making. Patients of reproductive age should be made aware of options for prenatal diagnosis and assisted reproduction including pre-implantation genetic diagnosis.   This information is based on current understanding of the gene and may change in the future.   Implications for Family Members: Hereditary predisposition to cancer due to pathogenic variants in the PALB2 gene has autosomal dominant inheritance.  This means that an individual with a pathogenic variant has a 50% chance of passing the condition on to his/her offspring. Identification of a pathogenic variant allows for the recognition of at-risk relatives who can pursue testing for the familial variant.   ***Ms. Peeks was offered a common hereditary cancer panel (34 genes) and an expanded pan-cancer panel (71 genes). Ms. Brancato was informed of the benefits and limitations of each panel, including that expanded pan-cancer panels contain several genes that do not have clear management guidelines at this point in time.  We also discussed that as the number of genes included on a panel increases, the chances of variants of uncertain significance increases.  After considering the benefits and limitations of each gene panel, Ms. Chavira elected to have ***.   Based on Ms. Cammack's {Personal/family:20331} history of cancer, she meets medical criteria for genetic testing. Despite that she meets criteria, she may still have an out of pocket cost. We discussed that if her out of pocket cost for testing is over $100, the laboratory will call and confirm whether she wants to proceed with testing.  If the out of pocket cost of testing is less than $100 she will be billed by the genetic testing laboratory.   ***We discussed with Ms. Vorndran that the {Personal/family:20331} history does not meet insurance or NCCN criteria for genetic testing and, therefore, is not highly consistent with a familial hereditary cancer syndrome.  We feel she is at low risk to harbor a gene mutation associated with such a condition. Thus, we did not recommend any  genetic testing, at this time, and recommended Ms. Carriger continue to follow the cancer screening guidelines given by her primary healthcare provider.  We discussed that some people do not want to undergo genetic testing due to fear of genetic discrimination.  A federal law called the Genetic Information  Non-Discrimination Act (GINA) of 2008 helps protect individuals against genetic discrimination based on their genetic test results.  It impacts both health insurance and employment.  With health insurance, it protects against increased premiums, being kicked off insurance or being forced to take a test in order to be insured.  For employment it protects against hiring, firing and promoting decisions based on genetic test results.  GINA does not apply to those in the [employer], those who work for companies with less than 15 employees, and new life insurance or long-term disability insurance policies.  Health status due to a cancer diagnosis is not protected under GINA.  PLAN: After considering the risks, benefits, and limitations, Ms. Honold provided informed consent to pursue genetic testing and the blood sample was sent to {Lab} Laboratories for analysis of the {test}. Results should be available within approximately {TAT TIME} weeks' time, at which point they will be disclosed by telephone to Ms. Olenick, as will any additional recommendations warranted by these results. Ms. Virtue will receive a summary of her genetic counseling visit and a copy of her results once available. This information will also be available in Epic.   *** Despite our recommendation, Ms. Ayotte did not wish to pursue genetic testing at today's visit. We understand this decision and remain available to coordinate genetic testing at any time in the future. We, therefore, recommend Ms. Koeppen continue to follow the cancer screening guidelines given by her primary healthcare provider.  ***Based on Ms. Inlow's family history, we recommended her ***, who was diagnosed with *** at age ***, have genetic counseling and testing. Ms. Tull will let us know if we can be of any assistance in coordinating genetic counseling and/or testing for this family member.   Lastly, we encouraged Ms. Wickes to remain in contact with cancer  genetics annually so that we can continuously update the family history and inform her of any changes in cancer genetics and testing that may be of benefit for this family.   Ms. Casavant questions were answered to her satisfaction today. Our contact information was provided should additional questions or concerns arise. Thank you for the referral and allowing [place] to share in the care of your patient.   Lalla Brothers, MS, [facility] Genetic Counselor [email or web address] (P) [phone]  The patient was seen for a total of *** minutes in face-to-face genetic counseling.  ***The patient brought ***.  ***The patient was seen alone.  Drs. Pamelia Hoit and/or Mosetta Putt were available to discuss this case as needed.  _______________________________________________________________________ For Office Staff:  Number of people involved in session: *** Was an Intern/ student involved with case: {YES/NO:63}

## 2022-12-13 ENCOUNTER — Encounter: Payer: Self-pay | Admitting: Genetic Counselor

## 2022-12-20 DIAGNOSIS — Z3A38 38 weeks gestation of pregnancy: Secondary | ICD-10-CM | POA: Diagnosis not present

## 2022-12-20 DIAGNOSIS — O26843 Uterine size-date discrepancy, third trimester: Secondary | ICD-10-CM | POA: Diagnosis not present

## 2022-12-26 ENCOUNTER — Ambulatory Visit (INDEPENDENT_AMBULATORY_CARE_PROVIDER_SITE_OTHER): Payer: BC Managed Care – PPO

## 2022-12-26 ENCOUNTER — Encounter (HOSPITAL_BASED_OUTPATIENT_CLINIC_OR_DEPARTMENT_OTHER): Payer: Self-pay | Admitting: *Deleted

## 2022-12-26 ENCOUNTER — Ambulatory Visit (INDEPENDENT_AMBULATORY_CARE_PROVIDER_SITE_OTHER): Payer: BC Managed Care – PPO | Admitting: *Deleted

## 2022-12-26 VITALS — Wt 197.6 lb

## 2022-12-26 DIAGNOSIS — Z6832 Body mass index (BMI) 32.0-32.9, adult: Secondary | ICD-10-CM | POA: Insufficient documentation

## 2022-12-26 DIAGNOSIS — O3680X0 Pregnancy with inconclusive fetal viability, not applicable or unspecified: Secondary | ICD-10-CM

## 2022-12-26 DIAGNOSIS — O3680X1 Pregnancy with inconclusive fetal viability, fetus 1: Secondary | ICD-10-CM

## 2022-12-26 DIAGNOSIS — Z3401 Encounter for supervision of normal first pregnancy, first trimester: Secondary | ICD-10-CM | POA: Diagnosis not present

## 2022-12-26 DIAGNOSIS — Z3A11 11 weeks gestation of pregnancy: Secondary | ICD-10-CM

## 2022-12-26 DIAGNOSIS — Z3400 Encounter for supervision of normal first pregnancy, unspecified trimester: Secondary | ICD-10-CM | POA: Insufficient documentation

## 2022-12-26 NOTE — Progress Notes (Signed)
New OB Intake  I explained I am completing New OB Intake today. We discussed EDD of 08/04/23 that is based on ultrasound today of 8+3. Pt is G1/P0. I reviewed her allergies, medications, Medical/Surgical/OB history, and appropriate screenings. I informed her of [facility] services. [facility] information placed in AVS. Based on history, this is a low risk pregnancy.  Patient Active Problem List   Diagnosis Date Noted   Positive pregnancy test 12/06/2022   Chest pain 09/20/2022   Chronic joint pain 07/10/2022   Chronic neck pain 07/10/2022   Paresthesias 07/10/2022   Chronic bilateral low back pain without sciatica 07/10/2022   Encounter for annual general medical examination with abnormal findings in adult 01/31/2021   Rash and nonspecific skin eruption 01/31/2021   Mass of right axilla 09/19/2020   Mass of left axilla 02/27/2019   Syncope 02/28/2016   Encounter for surveillance of contraceptive pills 09/23/2015   Obesity (BMI 30.0-34.9) 09/23/2015    Concerns addressed today  Delivery Plans Plans to deliver at [facility]. Patient given information for [facility] website for more information about [facility]. Patient is interested in water birth. Offered upcoming OB visit with CNM to discuss further.  MyChart/Babyscripts MyChart access verified. I explained pt will have some visits in office and some virtually. Babyscripts instructions given and order placed. Patient verifies receipt of registration text/e-mail. Account successfully created and app downloaded.  Blood Pressure Cuff/Weight Scale Patient has private insurance; instructed to purchase blood pressure cuff and bring to first prenatal appt. Explained after first prenatal appt pt will check weekly and document in Babyscripts. Patient does have weight scale.  Anatomy US Explained first scheduled [place] will be around 19 weeks. Anatomy US scheduled for 03/12/23 at 0915. Pt notified to arrive at 0930.  Labs Discussed  Avelina Laine genetic screening with patient. Would like both Panorama and Horizon drawn at new OB visit. Routine prenatal labs needed.  COVID Vaccine Patient has had COVID vaccine x 2 doses.   Social Determinants of Health Food Insecurity: Patient denies food insecurity. WIC Referral: Patient is interested in referral to [facility] Information provided to patient. Transportation: Patient denies transportation needs. Childcare: Discussed no children allowed at ultrasound appointments.  Interested in [place]? Yes, referral placed  First visit review I reviewed new OB appt with patient. I explained they will have a provider visit that includes PAP smear and labs. Explained pt will be seen by Vonzella Nipple, PA-C at first visit; encounter routed to appropriate provider. Explained that patient will be seen by pregnancy navigator following visit with provider.   Harrie Jeans, RN 12/26/2022  10:11 AM

## 2023-01-04 ENCOUNTER — Encounter: Payer: Self-pay | Admitting: Genetic Counselor

## 2023-01-04 ENCOUNTER — Telehealth: Payer: Self-pay | Admitting: Genetic Counselor

## 2023-01-04 DIAGNOSIS — Z1379 Encounter for other screening for genetic and chromosomal anomalies: Secondary | ICD-10-CM | POA: Insufficient documentation

## 2023-01-04 NOTE — Telephone Encounter (Signed)
I contacted Ms. Spurgin to discuss her genetic testing results. [facility] CancerNext-Expanded Panel+RNA was Negative. 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 She did NOT inherit the familial PALB2 mutation. Report date is 01/04/2023.   The test report has been scanned into EPIC and is located under the Molecular Pathology section of the Results Review tab.  A portion of the result report is included below for reference.   Lalla Brothers, MS, [facility] Genetic Counselor [email or web address] (P) [phone]

## 2023-01-09 ENCOUNTER — Encounter: Payer: Self-pay | Admitting: Medical

## 2023-01-09 ENCOUNTER — Other Ambulatory Visit (HOSPITAL_COMMUNITY)
Admission: RE | Admit: 2023-01-09 | Discharge: 2023-01-09 | Disposition: A | Discharge status: Home / Self Care | Payer: BC Managed Care – PPO | Source: Ambulatory Visit | Attending: Medical | Admitting: Medical

## 2023-01-09 ENCOUNTER — Ambulatory Visit (INDEPENDENT_AMBULATORY_CARE_PROVIDER_SITE_OTHER): Payer: BC Managed Care – PPO | Admitting: Medical

## 2023-01-09 ENCOUNTER — Encounter (HOSPITAL_BASED_OUTPATIENT_CLINIC_OR_DEPARTMENT_OTHER): Payer: Self-pay | Admitting: Medical

## 2023-01-09 ENCOUNTER — Encounter (HOSPITAL_BASED_OUTPATIENT_CLINIC_OR_DEPARTMENT_OTHER): Payer: Self-pay | Admitting: *Deleted

## 2023-01-09 VITALS — BP 108/70 | HR 92 | Wt 202.8 lb

## 2023-01-09 DIAGNOSIS — Z3401 Encounter for supervision of normal first pregnancy, first trimester: Secondary | ICD-10-CM | POA: Insufficient documentation

## 2023-01-09 DIAGNOSIS — Z3A00 Weeks of gestation of pregnancy not specified: Secondary | ICD-10-CM | POA: Insufficient documentation

## 2023-01-09 DIAGNOSIS — Z3A10 10 weeks gestation of pregnancy: Secondary | ICD-10-CM | POA: Diagnosis not present

## 2023-01-09 DIAGNOSIS — O99210 Obesity complicating pregnancy, unspecified trimester: Secondary | ICD-10-CM | POA: Insufficient documentation

## 2023-01-09 MED ORDER — ASPIRIN 81 MG PO TBEC: 81.0000 mg | DELAYED_RELEASE_TABLET | Freq: Every day | ORAL | 12 refills | Status: DC

## 2023-01-09 NOTE — Patient Instructions (Addendum)
Safe Medications in Pregnancy   Acne:  Benzoyl Peroxide  Salicylic Acid   Backache/Headache:  Tylenol: 2 regular strength every 4 hours OR               2 Extra strength every 6 hours   Colds/Coughs/Allergies:  Benadryl (alcohol free) 25 mg every 6 hours as needed  Breath right strips  Claritin  Cepacol throat lozenges  Chloraseptic throat spray  Cold-Eeze- up to three times per day  Cough drops, alcohol free  Flonase (by prescription only)  Guaifenesin  Mucinex  Robitussin DM (plain only, alcohol free)  Saline nasal spray/drops  Sudafed (pseudoephedrine) & Actifed * use only after [redacted] weeks gestation and if you do not have high blood pressure  Tylenol  Vicks Vaporub  Zinc lozenges  Zyrtec   Constipation:  Colace  Ducolax suppositories  Fleet enema  Glycerin suppositories  Metamucil  Milk of magnesia  Miralax  Senokot  Smooth move tea   Diarrhea:  Kaopectate  Imodium A-D   *NO pepto Bismol   Hemorrhoids:  Anusol  Anusol HC  Preparation H  Tucks   Indigestion:  Tums  Maalox  Mylanta  Zantac  Pepcid   Insomnia:  Benadryl (alcohol free) 25mg every 6 hours as needed  Tylenol PM  Unisom, no Gelcaps   Leg Cramps:  Tums  MagGel   Nausea/Vomiting:  Bonine  Dramamine  Emetrol  Ginger extract  Sea bands  Meclizine  Nausea medication to take during pregnancy:  Unisom (doxylamine succinate 25 mg tablets) Take one tablet daily at bedtime. If symptoms are not adequately controlled, the dose can be increased to a maximum recommended dose of two tablets daily (1/2 tablet in the morning, 1/2 tablet mid-afternoon and one at bedtime).  Vitamin B6 100mg tablets. Take one tablet twice a day (up to 200 mg per day).   Skin Rashes:  Aveeno products  Benadryl cream or 25mg every 6 hours as needed  Calamine Lotion  1% cortisone cream   Yeast infection:  Gyne-lotrimin 7  Monistat 7    **If taking multiple medications, please check labels to avoid  duplicating the same active ingredients  **take medication as directed on the label  ** Do not exceed 4000 mg of tylenol in 24 hours  **Do not take medications that contain aspirin or ibuprofen           [place] Pediatric Providers  [place] ([address]) [facility] Brown, MD; Chambliss, MD; Eniola, MD; Hensel, MD; McDiarmid, MD; McIntyer, MD [address] [place], [place] [address] [phone] Mon-Fri 8:30-12:30, 1:30-5:00  Providers come to see babies during newborn hospitalization Only accepting infants of Mother's who are seen at Family Medicine Center or have siblings seen at   [facility] Medicaid - Yes; Tricare - Yes   [facility] Mulberry, MD [address] [place], [place] [address] [phone] Mon, Tue, Thur, Fri 8:30-5:00, Wed 10:00-7:00 (closed 1-2pm daily for lunch) Takes [place] residents with no insurance.  [place] [facility] only with Medicaid/insurance; Tricare - no   [facility] Ben-Davies, MD; Brown, MD; Chandler, MD; Ettefagh, MD; Grant, MD; Hanvey, MD; Herrin, MD; Jones,  MD; Lester, MD; McCormick, MD; McQueen, MD; Simha, MD; Stanley, MD; Stryffeler, NP [address], [place],  [address] [phone] Mon, Tue, Thur, Fri 8:30-5:30, Wed 9:30-5:30, Sat 8:30-12:30 Only accepting infants of first-time parents or siblings of current patients   Hospital discharge coordinator will make follow-up appointment Medicaid - yes; Tricare - yes  [place] ([address])  [facility] Cox, MD; Davis, MD; Dovico, MD; Ettefaugh, MD; Lowe, MD; Nation, MD; Slimp, MD; Sumner, MD; Williams, MD [address], [place], [place] [address] [phone] Mon-Fri 8:30-5:00, closed for lunch 12:30-1:30; Sat-Sun 10:00-1:00 Accepting Newborns with commercial insurance only, must call prior to  delivery to be accepted into  practice.  Medicaid - no, Tricare - yes   [facility] [address] [place], [place] [address] [phone] or [phone] Mon to Fri 8am to 10pm, Sat 8am to 1pm (virtual only on weekends) Only accepts Medicaid Healthy Blue pts  [facility]  Artis, MD; Coccaro, MD; [place] Gardner, MD; Netherton, NP; Roper, MD; Wilmot, PA-C; Skinner, MD [address] [place], [place] [address] [phone] Mon-Fri 8:30-5:30 Medicaid - yes, Tricare - yes  [place] ([address]) [facility] Warner, MD [address], [place], [place] [address] [phone] Mon, Tues, Wed Fri 8:30-5:00, Sat 8:30-12:00, Closed Thursdays Accepting siblings of established patients and first time mom's if you call prenatally Medicaid- yes; Tricare - yes  [facility] Becker, PA; Hagler, MD; Quinn, PA-C; Scifres, PA; Sun, MD; Swayne, MD;  [address], [place], [place] [address] [phone] Mon-Fri 8:30-5:00, closed for lunch 1-2 Only accepting newborns of established patients Medicaid- no; Tricare - yes  [place] ([address]) [facility] [place] Timberlake, MD; [address], [place], [place] [address] [phone] Mon-Fri 8:00-5:00 Medicaid - No; Tricare - Yes  [facility] [employer]  Brake, NP; Wharton, PA [address], [place], [place] [address] [phone] Mon-Fri 8:00-5:00 Medicaid - No, Tricare - Yes  [facility] Gay, MD; Quinlan, MD; Blatt, DNP [address] [place], [place] [address] [phone]  Mon-Fri 8:00-5:00 Medicaid - No; Tricare - Yes  [facility] [address] [place], [place] [address] [phone] Mon-Fri 8:30-5:00 (lunch 12:00-1:00) Medicaid -Yes; Tricare - Yes  [facility] Jordan, MD [address],  [place], [place] [address] [phone] Mon-Fri 8:00-5:00 Seeing newborns of current patients only. No new patients Medicaid - No, Tricare - yes  [facility] [place] Parker, MD [address] [place], [place] [address] [phone] Mon-Fri 8:00-5:00 Medicaid -yes as secondary coverage only; Tricare - yes  [facility] Brecken, PA; Christy, NP; Dees, MD; DeClaire, MD; DeWeese, MD; Hodge, PA; Smoot, NP; Summer, MD; Vapne, MD [address] [place], [place] [address] [phone] Mon-Fri 8:30-5:00, Sat 9:00-11:00 Accepts commercial insurance ONLY. Offers free prenatal information sessions for families. Medicaid - No, Tricare - Call first  [facility] Bouska, MD; Gordon, PA; Jeffery, PA; Weber, PA [address] [place] [address] [phone] Mon-Fri 7:30-5:30 Medicaid - Yes; Tricare - yes  North  ([address] & [address])  [facility] Reese, MD [address] , [place] [address] [phone] Mon-Thur 8:00-6:00, closed for lunch 12-2, closed Fridays Medicaid - yes; Tricare - no  [facility] Anderson, NP; Badger, MD; Beal, PA; Spencer, PA [address], , [place] [address] [phone] Mon-Fri 7:30-4:30 Medicaid - yes, Tricare - yes  [facility]  Agbuya, MD; Klett, NP; Romgoolam, MD; Rothstein, NP [address], ,  [address] [phone] Mon-Fri 8:30-5:00, closed for lunch 1-2, Sat 8:30-12:00 - sick visits only Providers come to see babies   at [facility] Only accepting newborns of siblings and first time parents ONLY if who have met with office prior to delivery Medicaid -Yes; Tricare - yes  [facility] [place] [facility]  Golden, DO; Friddle, NP; Wallace, MD; Wood, MD:  [address], [place], [place] [address] [phone] Mon- Fri 8:00-5:00, Sat 9:00-12:00 - sick  visits only Accepting siblings of established patients and first time mom/baby Medicaid - Yes; Tricare - yes Patients must have vaccinations (baby vaccines)  [place] ([address] & [address])  [facility] [address] [place], [place] [address] [phone] Mon-Fri 8:00-5:00 Medicaid - no; Tricare - yes  [facility] Briscoe, MD; Schmidt, PA; Moreira, PA [address], [place], [place] [address] [phone] Mon-Fri 8:00-5:00 Medicaid- yes; Tricare - yes  [facility] [place] Boyd, MD; Jones, NP; Osborn, PA [address], [place], [place] [address] [phone] Mon-Fri 8:00-5:00 Medicaid - Yes; Tricare - yes  [place] ([address])  [facility] Atkinson, PA; Calderon, PA; Cummings, MD; Dillard, MD; Henrish, NP; Isenhour, DO; Martin, PA; Olson, MD; Ott, MD; Phillips, MD; Valente, PA; VanDeven, PA; Yonjof, NP [address], [place], [place] [address] [phone] Mon-Fri 8:30-5:00, Sat 9:00-12:00 - sick only Please register online [email or web address] then schedule online or call office Medicaid-Yes; Tricare -yes  [facility]  Dabrusco, MD; Dial, MD; St. [place], MD; Fleenor, NP; Goolsby, PA; Tonuzi, MD; Turner, NP; West, MD [address], [place], [place] [address] [phone] Mon-Fri 8:00-5:30, Sat&Sun by appointment (phones open at 8:30) Medicaid - Yes; Tricare - yes  [place] ([address]) [facility] Allen, CPNP; Bates, MD; Gordon, MD; Mills, NP; Weinshilboum, DO [address], [place], [place] [address] [phone] M-F 8:00 - 5:15, Sat/Sun 9-12 sick visits only Medicaid - No; Tricare - yes  [facility] [place] [facility]  Brown, PA-C; Cowen, PA-C; Dennis, DO; Fuster, PA-C; Martin, PA-C; Shelton,  PA-C; Spry, MD [address] [place], [place] [address] [phone] Mon-Thur 8:00-7:00, Fri 8:00-5:00 Accepting Medicaid for 13 and under only   [facility] [place] [facility] [place] Hayes, FNP; List, FNP; Moran, MD; Pitonzo, PA-C; Scholer, MD; Spangle, FNP; Nzenwa, FNP; J[place], MD; Moran, MD [address] [place], [place] [address] [phone] Mon-Fri 8:30-5:30 Medicaid - Yes; Tricare - yes  [facility] [address] [facility]  Kelly, CPNP; Logan, MD; Poth, MD; Ramadoss, MD; Staton, NP [address], [place], [place] [address] [phone] Mon-Thur 8:00-5:30, Fri 8:00-5:00, Sat 9:00-12:00 Medicaid - yes, Tricare - yes  [place] ([address])  [facility] [place] Masneri, DO; Meyers, MD; Nelson, PA [address], [place], [place] [address] [phone] Mon-Fri 8:00-5:00, closed for lunch 12-1 Medicaid - No; Tricare - yes  [facility] [place] McGowen, MD [address], [place], [place] [address] [phone] Mon-Fri 8:00-5:00 Medicaid - No; Tricare - yes  [facility] [place] MacDonald, MD; Nayak, MD; Kearns, MD; Jones, MD [address] [place] [address], [place], [place] [address] [phone] Mon-Fri 8:00-5:00 Medicaid- Yes; Tricare - yes  [place] ([address])   [facility] [place] [facility] Martin, PA-C; Tabori, MD [address], [place],  [address] [phone]   Mon-Fri 8:00-5:00 Medicaid - No; Tricare - yes  [facility] [place]  Margin - CPNP [address], [place], [place] [address] [phone] Mon-Weds 8:00-6:00, Thurs-Fri 8:00-5:00, Sat 9:00-12:00 Medicaid - yes; Tricare - yes   [facility] [place] Aubuchon, MD; Brandon, PA [address] [place], [place] [address] [phone] Mon-Fri 8:00-5:00 Medicaid - yes; Tricare - yes  [place]  Pediatric Providers  [facility] [address], [place], [place] [address] [phone] M, Thur: 8am -8pm, Tues, Weds: 8am - 5pm; Fri: 8-1 Medicaid - Yes; Tricare - yes  [facility] Mertz, MD; Johnson, MD; Wells, MD; Downs, PA; Hockenberger, PA [address], [place], [place] [address] [phone] M-F 8:30 - 5:00 Medicaid - Call office; Tricare -yes  [facility] Bonney, MD; Page, MD, Minter, MD; Mueller, PNP; Thomason, NP [address], [place], [place] [address] [phone] M-F 8:30 - 5:00, Sat/Sun 8:30 - 12:30 (sick visits) Medicaid - Call office; Tricare -yes  [facility] Lewis, MD; Shaub, PNP; Boylston, MD; Quaile, PA; Nonato, NP; Landon, CPNP [address], [place], [place] [address] [phone] M-F 8:30 - 5:00 Medicaid - Call office; Tricare - yes  [facility] [place] Cline, MD; Dvergsten, MD; Flores, MD; Kawatu, MD; Nogo, MD [address], [place], [place] [address] [phone] M-Thur: 8:00 - 5:00; Fri: 8:00 - 4:00 Medicaid - yes; Tricare - yes  [facility] [address] [place], [place], [place] [address] [phone] M-F: 8:30- 5:00, closed for lunch 12:30 - 1:00 Medicaid - yes; Tricare -yes  [facility] [place] [address], [place], [place] [address] [phone] M-F 8:00 - 5:00 Medicaid - yes; Tricare - yes  [facility] Johnson, DO; Rumball, DO; Wicker, NP [address] [place], [place], [place] [address] [phone] M-F 8:00 - 5:00, Closed 12-1 for lunch Medicaid - Call; Tricare - yes  [facility] Stein, MD [address], [place], [place] [address] [phone] M-F: 8:00-5:00, Sat: 8:00 - noon Medicaid - call; Tricare -yes  [place] [facility] Providers  [facility] Collins, FNP-C [address], [place], [place] [address] [phone] M-W: 8:00-5:00, Thur: 8:00 -  7:00, Fri: 8:00 - noon Medicaid - yes; Tricare - yes  [facility] [place] Adams, FNP [address], [place], [place] [address] [phone] M-F 8:00 - 5:00, Closed for lunch 12-1 Medicaid - yes; Tricare - yes  [place] [facility] Providers  [facility] Smith, FNP, Melvin, MD, Fay, FNP-C [address] [place] [address] [place], [place], [place] [address] [phone] M-T 8:00-5:00, Wed-Fri 7:00-6:00 Medicaid - Yes; Tricare -yes  [facility] [place] Civiletti, DO; [address], [place], [place] [address] [phone] M-F 8:00 - 5:00, closed for lunch 12-1 Medicaid - Yes; Tricare - yes  [facility]  Barnes, MD; Bergdolt, MD; [place]field, MD; Emrich, MD; Fiscus, MD; Hoppens, MD; Kylstra, MD, McPherson, MD; Todd, MD; Prestwood, MD; Waters, MD; Wood, MD [address], [place], [place] [address] [phone] M-F 8:00-5:00 Medicaid - yes; Tricare - yes  [facility] Cheema, MD (speaks Punjabi and Hindi) [address] [place], [place] [address] [phone] M-F: 8:30 - 5:00, closed 12:30 - 1 for lunch Medicaid - Yes; Tricare -yes  [place] [facility] Providers  [facility] Loda, MD; [place], MD; Burke, MD   [address], [place], [place] [address] [phone] M-Th: 8:00 - 5:30, Fri: 8:00 - 12:00 Medicaid - yes; Tricare - yes  [facility] [place] Lookabill, NP; Meier, MD; Daffron, MD [address], [place], [place] [address] [phone] M-F: 8:00 - 5:00 Medicaid - yes; Tricare - yes  [facility] Busse, NP; Bowman, NP; Baune, NP; Entwistle, MD; Williams, MD, Huffman, NP, Ferguson, MD; Patel, DO [address], [place], [place] [address] [phone] M-F: 8:30 - 5:30p Medicaid - yes; Tricare - yes Other locations available as well  [place] [facility] Rajan, MD; Wilson, MD; Morgan, PA-C, Domenech, PA-C; Myers, PA-C [address], [place], [place] [address] [phone] M-W: 8:00am - 7:00pm, Thurs: 8:00am - 8:00pm; Fri: 8:00am - 5:00pm, closed daily from 12-1 for lunch Medicaid - yes; Tricare - yes  [place] [facility] Providers  [facility] [place] Adams, MD; Crystal, FNP; Hadley, MD; Stokes, MD; Johnson, PNP; Brady, PA-C; West, PNP; Gardner, MD;  [address] [place], [place], [place] [address] [phone] M - Fri: 8am - 5pm, Sat 9-noon Medicaid - Yes; Tricare -yes  [facility] [place] Nayak, MD; Jones, FNP; McDonald, MD; Kearns, MD [address] [place] [address], [place], [place] [phone] M-F 8:00 - 5:00 Medicaid - call; Tricare - yes  [facility] [place] Bell, MD; Emory, PNP; Pinder, MD; Anderson, MD; Light, PA-C; Johnson, MD; Latta, MD; Saul, PNP; Rainey, MD; Clifford, MD; McClung, MD [address], [place], [place] [address] [phone] M-F 8:00am - 5:00pm; Sat. 9:00 - 11:00 Medicaid - yes; Tricare - yes  [facility] [place] Soldato-Couture, MD [address], [place], [place] [address] [phone] M-F 8:00 - 5:00 Medicaid - [place] Medicaid only; Tricare - yes  [facility] [place] Walker, MD; Davis, PNP; Ajizian, MD [address] [place] [address], [place], [place] [address] [phone] M-F 8:00 - 5:00 Medicaid - yes; Tricare - yes  [facility] [place] Barry, MD; Brown, MD, [place], MD, Hazek, MD; Hoyle, MD; Smith, MD; [address] [place], [place], [place] [address] [phone] M-F: 8-5 Medicaid - yes; Tricare - yes  [facility] [place] Brady, Md; Dowlen, MD; [address] [place] [address], [place], [place] [address] [phone] M-F 8-5 Medicaid - yes; Tricare - yes  [facility] Kearns, MD; Nayak, MD; [place]-Courture, MD; Pellam-Palmer, DNP;  Herring, PNP [address], [place], [place] [address] [phone] M-F 8-5 Medicaid - yes; Tricare - yes  [facility] Weathers, MD; Merritt, PA-C; Davis-PA-C; Warnimont, MD [address], [place], [place] [address] [phone] M-F 7am - 5 pm Medicaid - call; Tricare - yes  [facility] Hill, PNP; Erickson, MD; Robinson, MD [address], [place], [place] [address] [phone] M-F 8-5 Medicaid - yes; Tricare - yes  [facility] Kribbs, MD; Warner, MD; Williams, FNP; Brooks, FNP; Boles, FNP; Romblad, PA-C; [place] - FNP [address], [place], [place] [address] [phone] M-F 8-5 Medicaid- yes; Tricare - yes  [facility] [place] [place] [place] [place] Yoder, MD; Verenes, MD; Armentrout, MD; Stewart, MD; Beasley, CPNP; Ford, MD; Erickson, MD; Rice, MD [address], [place], [place] [address] [phone] M-F: 8-5, Sat: 9-4, Sun 9-12 Medicaid - yes; Tricare - yes  [facility] Little, PNP; Davis, PNP [address], [place],    [address] [phone] M-F 8 - 5, closed 12-1 for lunch Medicaid - yes; Tricare - yes  [facility] Friesen, MD; Cnegia, MD; Rice, MD; Patel, DO [address], [place], [place] [address] [phone] M-F 8- 5:30 Medicaid - yes; Tricare - yes  [facility] [place] Zvolensky, MD; Ray, MD; Haas, MD [address] [address], [place], [place] [address] [phone] M: 8-7; Tues-Fri: 8-5; Sat: 9-12 Medicaid - yes; Tricare - yes  [facility] [place] Heinrich, MD; Meyer, MD; Clark, MD; Rhyne, MD; Aubuchon, MD [address], [place], [place] [address] [phone] M: 8-7; Tues-Fri: 8-5; Sat: 8:30-12:30 Medicaid - yes;  Tricare - yes  [facility] [place] Bista, MD; Dillard, PA [address], [place], [place] [address] [phone] Mon-Fri: 8-5 Medicaid - yes; Tricare - yes  [facility] [place] Beasley, CPNP; Mahle, CPNP; Rice, MD; Duffy, MD; Culler, MD; [address], [place], [place] [address] [phone] M-F: 8-5, closed 1-2 for lunch Medicaid - yes; Tricare - yes  [facility] Rickman, PA; Mounce, NP; Smith, MD; Jordan, CPNP; Darty, PA; Ball, MD; Wallace, MD [address] [place] [address], [place], [place] [address] [phone] M-Thurs: 8-7; Fri: 8-6; Sat: 9-12; Sun 2-4 Medicaid - yes; Tricare - yes  [facility] Brown, MD; Shin, MD; Goodman, DNP, FNP; Sebesta, DO; [address], [place], [place] [address] [phone] M-F: 8-5 Medicaid - yes; Tricare - yes  [place] [facility] Providers  [facility] Dough, MD; Welsh, NP [address], [place], [place] [address] [phone] M - Fri: 8am - 5pm, closed for lunch 12-1 Medicaid - Yes; Tricare - yes  [facility] Manandhar, MD; Riley, MD; Sanger, DO; Vinocur, MD;Hall, PA; Walsh, PA; Campbell, NP [address], [place] [address] [phone] M-F 8:00 - 5:00, Sat 8:00 - 11:30 Medicaid - yes; Tricare - yes  [facility] Khan, MD; Redding, MD, Street, MD, Holt, MD, Burgart, MD; Rhyne, NP; Dickinson, PA;  [address] [place] [address], [place], [place] [address] [phone] M-F 8:10am - 5:00pm Medicaid - yes; Tricare - yes  [facility] Connors, MD; Kime, NP [address], [place], [place] [address] [phone] M-F 8:00 - 5:00 Medicaid - [place] Medicaid only; Tricare - yes  [facility] [place] Whyte, MD; Fox, NP [address], [place], [place] [address] [phone] M-F 8:00 - 5:00; Closed for lunch 12 - 1:00 Medicaid - yes; Tricare - yes  [facility] Penner, MD; Wilburn, FNP [address], [place], [place] [address] [phone] Mon 9-5; Tues/Wed 10-5; Thurs 8:30-5; Fri: 8-12:30 Medicaid - yes; Tricare - yes  [place] [facility] Providers  [facility]  Golding, MD; Jackson, PA-C [address] [place] [address], [place], [place] [address] [phone] phone [phone] fax M-F 7:15 - 4:30 Medicaid - yes; Tricare - yes  [facility] Gosrani, MD; Meccariello, DO [address] [place],  [address] [phone] M-Fri: 8:30 - 5:00, closed for lunch everyday noon - 1pm Medicaid - Yes; Tricare - yes  [facility] Burdine, MD; Daniel, MD; Howard, MD; Sasser, MD; Boles, PA; Boyd, PA-C; Carroll, PA; McGee, PA; Skillman, PA;   Wilson, PA [address] [place], [place] [address] [phone] M-Thurs: 7:30am - 7:00pm; Friday 7:30am - 4pm; Sat: 8:00 - 1:00 Medicaid - Yes; Tricare - yes  [facility] [place] Akhbari, MD; Law, MD; Qayumi, MD; Salvador, DO [address], [place], [place] [address] [phone] M-Thur: 8:00 - 5:00, Fri: 8:00 - Noon Medicaid - yes; Tricare - yes No [place] [employer]  [facility] [place] [facility] Dettinger, MD; Gottschalk, DO; Hawks, NP; Martin, NP; Morgan, NP; Milian, NP; Rakes, NP; Stacks, MD; Webster, PA [address] [place], [place], [place] [address] [phone] M-F 8:00 - 5:00 Medicaid - yes; Tricare - yes  [facility] Collins, FNP-C; Bucio, FNP-C [address], [place], [place] [address] [phone] M, W, R 8:00-5:00, Tues: 8:00am - 7:00pm; Fri 8:00 - noon Medicaid - Yes; Tricare - yes  [facility] Khan, MD [address] [place]  [address] [place], [place] [address] [phone]  M-Thurs 8:30-5:30, Fri: 8:30-12:30pm Medicaid - Yes; Tricare - N    Childbirth Education Options: [place] [facility] Classes:  Childbirth education classes can help you get ready for a positive parenting experience. You can also meet other expectant parents and get free stuff for your baby. Each class runs for five weeks on the same night and costs $45 for the mother-to-be and her support person. Medicaid covers the cost if you are eligible. Call [phone] to register. [facility] Childbirth Education: Classes can vary in availability and schedule is subject to change. For most up-to-date information please visit [email or web address] to review and register.        

## 2023-01-09 NOTE — Progress Notes (Signed)
PRENATAL VISIT NOTE  Subjective:  Lindsay Barnett is a 27 y.o. G1P0 at [redacted]w[redacted]d being seen today for her first prenatal visit for this pregnancy.  She is currently monitored for the following issues for this low-risk pregnancy and has Supervision of normal first pregnancy; BMI 32.0-32.9,adult; Genetic testing; and Obesity in pregnancy, antepartum on their problem list.  Patient reports no complaints.  Contractions: Not present. Vag. Bleeding: None.  Movement: Absent. Denies leaking of fluid.   She is planning to breastfeed. Desires contraception, unsure of method.   The following portions of the patient's history were reviewed and updated as appropriate: allergies, current medications, past family history, past medical history, past social history, past surgical history and problem list.   Objective:   Vitals:   01/09/23 1114  BP: 108/70  Pulse: 92  Weight: 202 lb 12.8 oz (92 kg)    Fetal Status: Fetal Heart Rate (bpm): 165   Movement: Absent     General:  Alert, oriented and cooperative. Patient is in no acute distress.  Skin: Skin is warm and dry. No rash noted.   Cardiovascular: Normal heart rate and rhythm noted  Respiratory: Normal respiratory effort, no problems with respiration noted. Clear to auscultation.   Abdomen: Soft, gravid, appropriate for gestational age. Normal bowel sounds. Non-tender. Pain/Pressure: Present     Pelvic: Cervical exam performed Dilation: Closed Effacement (%): Thick   Normal cervical contour, no lesions, scant bleeding following pap, normal discharge  Extremities: Normal range of motion.  Edema: None  Mental Status: Normal mood and affect. Normal behavior. Normal judgment and thought content.    Indications for ASA therapy (per uptodate) One of the following: Previous pregnancy with preeclampsia, especially early onset and with an adverse outcome No Multifetal gestation No Chronic hypertension No Type 1 or 2 diabetes mellitus No Chronic  kidney disease No Autoimmune disease (antiphospholipid syndrome, systemic lupus erythematosus) No  Two or more of the following: Nulliparity Yes Obesity (body mass index >30 kg/m2) Yes Family history of preeclampsia in mother or sister No Age ?35 years No Sociodemographic characteristics (African American race, low socioeconomic level) Yes Personal risk factors (eg, previous pregnancy with low birth weight or small for gestational age infant, previous adverse pregnancy outcome [eg, stillbirth], interval >10 years between pregnancies) No  Indications for early GDM screening  First-degree relative with diabetes No BMI >30kg/m2 Yes Age > 35 No Previous birth of an infant weighing ?4000 g No Gestational diabetes mellitus in a previous pregnancy No Glycated hemoglobin ?5.7 percent (39 mmol/mol), impaired glucose tolerance, or impaired fasting glucose on previous testing No High-risk race/ethnicity (eg, African American, Latino, Native American, [place] American, Pacific Islander) Yes Previous stillbirth of unknown cause No Maternal birthweight > 9 lbs No History of cardiovascular disease No Hypertension or on therapy for hypertension No High-density lipoprotein cholesterol level <35 mg/dL (9.52 mmol/L) and/or a triglyceride level >250 mg/dL (8.41 mmol/L) No Polycystic ovary syndrome No Physical inactivity No Other clinical condition associated with insulin resistance (eg, severe obesity, acanthosis nigricans) No Current use of glucocorticoids No   Assessment and Plan:  Pregnancy: G1P0 at [redacted]w[redacted]d 1. Encounter for supervision of normal first pregnancy in first trimester - ABO/Rh - Antibody screen - CBC - Hepatitis B surface antigen - HIV Antibody (routine testing w rflx) - HIV (Save tube for possible reflex) - RPR - Rubella screen - Hepatitis C antibody - Urine Culture - Cytology - PAP( [place]) - [place] Basic Panel - Panorama Prenatal Test - Anatomy  US scheduled 7/30 - Work  restriction note given   2. Obesity in pregnancy, antepartum - Hemoglobin A1c - aspirin EC 81 MG tablet; Take 1 tablet (81 mg total) by mouth daily. Swallow whole.  Dispense: 30 tablet; Refill: 12 - Start ASA at 12 weeks   3. [redacted] weeks gestation of pregnancy    Preterm labor/first trimester warning symptoms and general obstetric precautions including but not limited to vaginal bleeding, contractions, leaking of fluid and fetal movement were reviewed in detail with the patient. Please refer to After Visit Summary for other counseling recommendations.   Discussed the normal visit cadence for prenatal care Discussed the nature of our practice with multiple providers including residents and students   Return in about 4 weeks (around 02/06/2023) for LOB, In-Person, any provider.  Future Appointments  Date Time Provider Department Center  02/13/2023  9:55 AM Jerene Bears, MD [facility]  03/12/2023  9:15 AM [facility] NURSE [facility]  03/12/2023  9:30 AM [facility] US2 [facility]  03/12/2023 11:15 AM Jerene Bears, MD [facility]  04/09/2023 10:15 AM Jerene Bears, MD [facility]  05/07/2023  8:35 AM Jerene Bears, MD [facility]  05/28/2023  9:55 AM Jerene Bears, MD [facility]  06/11/2023 10:35 AM Jerene Bears, MD [facility]  06/25/2023  9:55 AM Jerene Bears, MD [facility]  07/09/2023  9:15 AM Jerene Bears, MD [facility]  07/17/2023  9:55 AM Jerene Bears, MD [facility]  07/24/2023  9:35 AM Jerene Bears, MD [facility]  07/30/2023  9:35 AM Jerene Bears, MD [facility]  08/05/2023  9:55 AM Jerene Bears, MD [facility]    Vonzella Nipple, PA-C

## 2023-01-10 LAB — ANTIBODY SCREEN: Antibody Screen: NEGATIVE

## 2023-01-10 LAB — HEMOGLOBIN A1C
Est. average glucose Bld gHb Est-mCnc: 97 mg/dL
Hgb A1c MFr Bld: 5 % (ref 4.8–5.6)

## 2023-01-10 LAB — CBC
Hematocrit: 37.2 % (ref 34.0–46.6)
Hemoglobin: 12.7 g/dL (ref 11.1–15.9)
MCH: 30.8 pg (ref 26.6–33.0)
MCHC: 34.1 g/dL (ref 31.5–35.7)
MCV: 90 fL (ref 79–97)
Platelets: 309 10*3/uL (ref 150–450)
RBC: 4.13 x10E6/uL (ref 3.77–5.28)
RDW: 13.3 % (ref 11.7–15.4)
WBC: 7.1 10*3/uL (ref 3.4–10.8)

## 2023-01-10 LAB — ABO/RH: Rh Factor: POSITIVE

## 2023-01-10 LAB — RPR: RPR Ser Ql: NONREACTIVE

## 2023-01-10 LAB — RUBELLA SCREEN: Rubella Antibodies, IGG: 3.91 index (ref >0.99)

## 2023-01-10 LAB — HEPATITIS C ANTIBODY: Hep C Virus Ab: NONREACTIVE

## 2023-01-10 LAB — HIV ANTIBODY (ROUTINE TESTING W REFLEX): HIV Screen 4th Generation wRfx: NONREACTIVE

## 2023-01-10 LAB — HEPATITIS B SURFACE ANTIGEN: Hepatitis B Surface Ag: NEGATIVE

## 2023-01-11 ENCOUNTER — Encounter (HOSPITAL_BASED_OUTPATIENT_CLINIC_OR_DEPARTMENT_OTHER): Payer: Self-pay | Admitting: *Deleted

## 2023-01-11 ENCOUNTER — Encounter: Payer: Self-pay | Admitting: Genetic Counselor

## 2023-01-11 ENCOUNTER — Ambulatory Visit: Payer: Self-pay | Admitting: Genetic Counselor

## 2023-01-11 DIAGNOSIS — Z1379 Encounter for other screening for genetic and chromosomal anomalies: Secondary | ICD-10-CM

## 2023-01-11 LAB — URINE CULTURE

## 2023-01-11 NOTE — Progress Notes (Signed)
HPI:   Ms. Frankland was previously seen in the [facility] due to a family history of a PALB2 gene mutation in her mother. Please refer to our prior cancer genetics clinic note for more information regarding our discussion, assessment and recommendations, at the time. Ms. Ruth recent genetic test results were disclosed to her, as were recommendations warranted by these results. These results and recommendations are discussed in more detail below.  FAMILY HISTORY:  We obtained a detailed, 4-generation family history.  Significant diagnoses are listed below:      Family History  Problem Relation Age of Onset   Hypertension Mother     Breast cancer Mother 57        contralateral breast cancer dx. 2   Other Mother          PALB2+   Prostate cancer Father 66   Narcolepsy Brother     Breast cancer Maternal Aunt 22        PALB2+   Lung cancer Maternal Grandfather 49   Prostate cancer Paternal Grandfather 74 - 89   Pancreatic cancer Maternal Great-grandfather 75 - 45        maternal grandmother's father   Ovarian cancer Maternal Great-grandmother 81        maternal grandmother's mother           Ms. Hartt's mother was diagnosed with right breast cancer at age 92 and left breast cancer at age 29. She had [employer] 90 gene panel and was found to have a PALB2 gene mutation ([id number]). Ms. Cuccio maternal aunt was diagnosed with breast cancer at age 50, she also has the familial PALB2 gene mutation. Her maternal grandfather was diagnosed with lung cancer at age 85 and died at 107. Ms. Gerdts's maternal great grandmother (grandmother's mother) was diagnosed with ovarian cancer at an unknown age, she died at age 18. Her maternal great grandfather (grandmother's father) was diagnosed with pancreatic cancer in his 39s/90s, he is deceased. Ms. Marton father was diagnosed with prostate cancer at age 76 and her paternal grandfather was diagnosed with prostate cancer  in his 19s. There is no reported Ashkenazi Jewish ancestry.   GENETIC TEST RESULTS:  The [facility] CancerNext-Expanded Panel found no pathogenic mutations. She did NOT inherit the familial PALB2 mutation.    The CancerNext-Expanded gene panel offered by [facility] and includes sequencing, rearrangement, and RNA analysis for the following 71 genes: AIP, ALK, APC, ATM, AXIN2, BAP1, BARD1, BMPR1A, BRCA1, BRCA2, BRIP1, CDC73, CDH1, CDK4, CDKN1B, CDKN2A, CHEK2, CTNNA1, DICER1, FH, FLCN, KIF1B, LZTR1, MAX, MEN1, MET, MLH1, MSH2, MSH3, MSH6, MUTYH, NF1, NF2, NTHL1, PALB2, PHOX2B, PMS2, POT1, PRKAR1A, PTCH1, PTEN, RAD51C, RAD51D, RB1, RET, SDHA, SDHAF2, SDHB, SDHC, SDHD, SMAD4, SMARCA4, SMARCB1, SMARCE1, STK11, SUFU, TMEM127, TP53, TSC1, TSC2, and VHL (sequencing and deletion/duplication); EGFR, EGLN1, HOXB13, KIT, MITF, PDGFRA, POLD1, and POLE (sequencing only); EPCAM and GREM1 (deletion/duplication only).   The test report has been scanned into EPIC and is located under the Molecular Pathology section of the Results Review tab.  A portion of the result report is included below for reference. Genetic testing reported out on 01/04/2023.       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  At this time, it is unknown if this variant is associated with an increased risk for cancer or if it is benign, but most uncertain variants are reclassified to benign. It should not be used to make medical management  decisions. With time, we suspect the laboratory will determine the significance of this variant, if any. If the laboratory reclassifies this variant, we will attempt to contact Ms. Hew to discuss it further. We do not recommend familial testing for the VUS.  ADDITIONAL GENETIC TESTING:  We discussed with Ms. Clegg that her genetic testing was fairly extensive.  If there are genes identified to increase cancer risk that can be analyzed in the future,  we would be happy to discuss and coordinate this testing at that time.    CANCER SCREENING RECOMMENDATIONS:  Ms. Nauta test was normal and did not reveal the familial mutation. We call this result a true negative result because the cancer-causing mutation was identified in Ms. Masini's family, and she did not inherit it.  Given this negative result, Ms. Blakley's chances of developing PALB2-related cancers are the same as they are in the general population. As all women are at risk for cancer, she is recommended to have general population screening for cancer.   FOLLOW-UP:  Cancer genetics is a rapidly advancing field and it is possible that new genetic tests will be appropriate for her and/or her family members in the future. We encouraged her to remain in contact with cancer genetics on an annual basis so we can update her personal and family histories and let her know of advances in cancer genetics that may benefit this family.   Our contact number was provided. Ms. Hruska questions were answered to her satisfaction, and she knows she is welcome to call us at anytime with additional questions or concerns.   Lalla Brothers, MS, [facility] Genetic Counselor [email or web address] (P) [phone]

## 2023-01-15 LAB — CYTOLOGY - PAP
Chlamydia: NEGATIVE
Comment: NEGATIVE
Comment: NORMAL
Diagnosis: NEGATIVE
Diagnosis: REACTIVE
Neisseria Gonorrhea: NEGATIVE

## 2023-01-15 LAB — PANORAMA PRENATAL TEST FULL PANEL:PANORAMA TEST PLUS 5 ADDITIONAL MICRODELETIONS: FETAL FRACTION: 10.3

## 2023-01-18 LAB — HORIZON CUSTOM: REPORT SUMMARY: NEGATIVE

## 2023-02-13 ENCOUNTER — Ambulatory Visit (INDEPENDENT_AMBULATORY_CARE_PROVIDER_SITE_OTHER): Payer: BC Managed Care – PPO | Admitting: Obstetrics & Gynecology

## 2023-02-13 ENCOUNTER — Other Ambulatory Visit (HOSPITAL_COMMUNITY)
Admission: RE | Admit: 2023-02-13 | Discharge: 2023-02-13 | Disposition: A | Discharge status: Home / Self Care | Payer: BC Managed Care – PPO | Source: Ambulatory Visit | Attending: Obstetrics & Gynecology | Admitting: Obstetrics & Gynecology

## 2023-02-13 VITALS — BP 114/69 | HR 87 | Wt 211.2 lb

## 2023-02-13 DIAGNOSIS — Z3401 Encounter for supervision of normal first pregnancy, first trimester: Secondary | ICD-10-CM

## 2023-02-13 DIAGNOSIS — Z3A15 15 weeks gestation of pregnancy: Secondary | ICD-10-CM | POA: Diagnosis not present

## 2023-02-13 DIAGNOSIS — Z3492 Encounter for supervision of normal pregnancy, unspecified, second trimester: Secondary | ICD-10-CM | POA: Diagnosis not present

## 2023-02-13 NOTE — Progress Notes (Signed)
   PRENATAL VISIT NOTE  Subjective:  Lindsay Barnett is a 27 y.o. G1P0 at [redacted]w[redacted]d being seen today for ongoing prenatal care.  She is currently monitored for the following issues for this low-risk pregnancy and has Supervision of normal first pregnancy; BMI 32.0-32.9,adult; Genetic testing; and Obesity in pregnancy, antepartum on their problem list.  Patient reports no complaints.  Contractions: Not present. Vag. Bleeding: None.  Movement: Present. Denies leaking of fluid.   The following portions of the patient's history were reviewed and updated as appropriate: allergies, current medications, past family history, past medical history, past social history, past surgical history and problem list.   Objective:   Vitals:   02/13/23 1017  BP: 114/69  Pulse: 87  Weight: 211 lb 3.2 oz (95.8 kg)    Fetal Status: Fetal Heart Rate (bpm): 153   Movement: Present     General:  Alert, oriented and cooperative. Patient is in no acute distress.  Skin: Skin is warm and dry. No rash noted.   Cardiovascular: Normal heart rate noted  Respiratory: Normal respiratory effort, no problems with respiration noted  Abdomen: Soft, gravid, appropriate for gestational age.  Pain/Pressure: Absent     Pelvic: Cervical exam deferred        Extremities: Normal range of motion.  Edema: None  Mental Status: Normal mood and affect. Normal behavior. Normal judgment and thought content.   Assessment and Plan:  Pregnancy: G1P0 at [redacted]w[redacted]d 1. [redacted] weeks gestation of pregnancy - on PNV - recommended starting baby ASA - Cervicovaginal ancillary only( [place]) - AFP, Serum, Open Spina Bifida  2. Encounter for supervision of normal first pregnancy in first trimester - Cervicovaginal ancillary only( [place])  Preterm labor symptoms and general obstetric precautions including but not limited to vaginal bleeding, contractions, leaking of fluid and fetal movement were reviewed in detail with the patient. Please refer  to After Visit Summary for other counseling recommendations.   Return in about 4 weeks (around 03/13/2023).  Future Appointments  Date Time Provider Department Center  03/12/2023  9:15 AM [facility] NURSE [facility]  03/12/2023  9:30 AM [facility] US2 [facility]  03/14/2023  8:55 AM Marny Lowenstein, PA-C [facility]  04/09/2023 10:15 AM Jerene Bears, MD [facility]  05/07/2023  8:35 AM Jerene Bears, MD [facility]  05/28/2023  9:55 AM Jerene Bears, MD [facility]  06/11/2023 10:35 AM Jerene Bears, MD [facility]  06/25/2023  9:55 AM Jerene Bears, MD [facility]  07/09/2023  9:15 AM Jerene Bears, MD [facility]  07/17/2023  9:55 AM Jerene Bears, MD [facility]  07/24/2023  9:35 AM Jerene Bears, MD [facility]  07/30/2023  9:35 AM Jerene Bears, MD [facility]  08/05/2023  9:55 AM Jerene Bears, MD [facility]    Jerene Bears, MD

## 2023-02-13 NOTE — Addendum Note (Signed)
Addended by: Harrie Jeans on: 02/13/2023 10:56 AM   Modules accepted: Orders

## 2023-02-15 LAB — CERVICOVAGINAL ANCILLARY ONLY
Chlamydia: NEGATIVE
Comment: NEGATIVE
Comment: NORMAL
Neisseria Gonorrhea: NEGATIVE

## 2023-02-16 LAB — AFP, SERUM, OPEN SPINA BIFIDA
AFP MoM: 1.19
AFP Value: 32.4 ng/mL
Gest. Age on Collection Date: 15.3 weeks
Maternal Age At EDD: 26.6 yr
OSBR Risk 1 IN: 10000
Test Results:: NEGATIVE
Weight: 211 [lb_av]

## 2023-03-12 ENCOUNTER — Ambulatory Visit: Payer: BC Managed Care – PPO | Attending: Obstetrics & Gynecology

## 2023-03-12 ENCOUNTER — Ambulatory Visit: Payer: BC Managed Care – PPO | Admitting: *Deleted

## 2023-03-12 ENCOUNTER — Encounter (HOSPITAL_BASED_OUTPATIENT_CLINIC_OR_DEPARTMENT_OTHER): Payer: BC Managed Care – PPO | Admitting: Obstetrics & Gynecology

## 2023-03-12 VITALS — BP 125/56 | HR 82

## 2023-03-12 DIAGNOSIS — Z3401 Encounter for supervision of normal first pregnancy, first trimester: Secondary | ICD-10-CM | POA: Insufficient documentation

## 2023-03-12 DIAGNOSIS — O99212 Obesity complicating pregnancy, second trimester: Secondary | ICD-10-CM | POA: Diagnosis not present

## 2023-03-12 DIAGNOSIS — Z6832 Body mass index (BMI) 32.0-32.9, adult: Secondary | ICD-10-CM | POA: Insufficient documentation

## 2023-03-12 DIAGNOSIS — Z3402 Encounter for supervision of normal first pregnancy, second trimester: Secondary | ICD-10-CM | POA: Insufficient documentation

## 2023-03-12 DIAGNOSIS — Z3A19 19 weeks gestation of pregnancy: Secondary | ICD-10-CM | POA: Diagnosis not present

## 2023-03-12 DIAGNOSIS — Z363 Encounter for antenatal screening for malformations: Secondary | ICD-10-CM | POA: Diagnosis not present

## 2023-03-14 ENCOUNTER — Ambulatory Visit (HOSPITAL_BASED_OUTPATIENT_CLINIC_OR_DEPARTMENT_OTHER): Payer: BC Managed Care – PPO | Admitting: Student

## 2023-03-14 VITALS — BP 128/72 | HR 77 | Wt 219.0 lb

## 2023-03-14 DIAGNOSIS — Z3402 Encounter for supervision of normal first pregnancy, second trimester: Secondary | ICD-10-CM

## 2023-03-14 DIAGNOSIS — Z3A19 19 weeks gestation of pregnancy: Secondary | ICD-10-CM

## 2023-03-14 NOTE — Progress Notes (Signed)
   PRENATAL VISIT NOTE  Subjective:  Lindsay Barnett is a 27 y.o. G1P0 at [redacted]w[redacted]d being seen today for ongoing prenatal care.  She is currently monitored for the following issues for this low-risk pregnancy and has Supervision of normal first pregnancy; BMI 32.0-32.9,adult; Genetic testing; and Obesity in pregnancy, antepartum on their problem list.  Patient reports no complaints.  Contractions: Not present. Vag. Bleeding: None.  Movement: Present. Denies leaking of fluid.   The following portions of the patient's history were reviewed and updated as appropriate: allergies, current medications, past family history, past medical history, past social history, past surgical history and problem list.   Objective:   Vitals:   03/14/23 0909  BP: 128/72  Pulse: 77  Weight: 219 lb (99.3 kg)    Fetal Status: Fetal Heart Rate (bpm): 143   Movement: Present     General:  Alert, oriented and cooperative. Patient is in no acute distress.  Skin: Skin is warm and dry. No rash noted.   Cardiovascular: Normal heart rate noted  Respiratory: Normal respiratory effort, no problems with respiration noted  Abdomen: Soft, gravid, appropriate for gestational age.  Pain/Pressure: Absent     Pelvic: Cervical exam deferred        Extremities: Normal range of motion.  Edema: None  Mental Status: Normal mood and affect. Normal behavior. Normal judgment and thought content.   Assessment and Plan:  Pregnancy: G1P0 at [redacted]w[redacted]d 1. Encounter for supervision of normal first pregnancy in second trimester - reports feeling slight fetal movements or flutters - bASA therapy  2. [redacted] weeks gestation of pregnancy - anticipatory guidance provided - continue routine follow-up - anatomy scan was reassuring  Preterm labor symptoms and general obstetric precautions including but not limited to vaginal bleeding, contractions, leaking of fluid and fetal movement were reviewed in detail with the patient. Please refer to After  Visit Summary for other counseling recommendations.   Return in about 4 weeks (around 04/11/2023) for LOB, IN-PERSON.  Future Appointments  Date Time Provider Department Center  04/09/2023 10:15 AM Jerene Bears, MD [facility]  05/07/2023  8:35 AM Jerene Bears, MD [facility]  05/28/2023  9:55 AM Jerene Bears, MD [facility]  06/11/2023 10:35 AM Jerene Bears, MD [facility]  06/25/2023  9:55 AM Jerene Bears, MD [facility]  07/09/2023  9:15 AM Jerene Bears, MD [facility]  07/17/2023  9:55 AM Jerene Bears, MD [facility]  07/24/2023  9:35 AM Jerene Bears, MD [facility]  07/30/2023  9:35 AM Jerene Bears, MD [facility]  08/05/2023  9:55 AM Jerene Bears, MD [facility]    Corlis Hove, NP

## 2023-04-09 ENCOUNTER — Ambulatory Visit (INDEPENDENT_AMBULATORY_CARE_PROVIDER_SITE_OTHER): Payer: BC Managed Care – PPO | Admitting: Obstetrics & Gynecology

## 2023-04-09 VITALS — BP 125/73 | HR 90 | Wt 227.6 lb

## 2023-04-09 DIAGNOSIS — Z3A23 23 weeks gestation of pregnancy: Secondary | ICD-10-CM

## 2023-04-09 DIAGNOSIS — Z6832 Body mass index (BMI) 32.0-32.9, adult: Secondary | ICD-10-CM

## 2023-04-09 DIAGNOSIS — Z3402 Encounter for supervision of normal first pregnancy, second trimester: Secondary | ICD-10-CM

## 2023-04-09 DIAGNOSIS — K219 Gastro-esophageal reflux disease without esophagitis: Secondary | ICD-10-CM

## 2023-04-09 DIAGNOSIS — Z1379 Encounter for other screening for genetic and chromosomal anomalies: Secondary | ICD-10-CM

## 2023-04-09 MED ORDER — FAMOTIDINE 40 MG PO TABS: ORAL_TABLET | ORAL | 3 refills | Status: DC

## 2023-04-11 NOTE — Progress Notes (Signed)
   PRENATAL VISIT NOTE  Subjective:  Lindsay Barnett is a 27 y.o. G1P0 at [redacted]w[redacted]d being seen today for ongoing prenatal care.  She is currently monitored for the following issues for this low-risk pregnancy and has Supervision of normal first pregnancy; BMI 32.0-32.9,adult; Genetic testing; and Obesity in pregnancy, antepartum on their problem list.  Patient reports some reflux.  Needs more than TUMS.  Contractions: Not present. Vag. Bleeding: None.  Movement: Present. Denies leaking of fluid.   The following portions of the patient's history were reviewed and updated as appropriate: allergies, current medications, past family history, past medical history, past social history, past surgical history and problem list.   Objective:   Vitals:   04/09/23 1050  BP: 125/73  Pulse: 90  Weight: 227 lb 9.6 oz (103.2 kg)    Fetal Status: Fetal Heart Rate (bpm): 143 Fundal Height: 24 cm Movement: Present     General:  Alert, oriented and cooperative. Patient is in no acute distress.  Skin: Skin is warm and dry. No rash noted.   Cardiovascular: Normal heart rate noted  Respiratory: Normal respiratory effort, no problems with respiration noted  Abdomen: Soft, gravid, appropriate for gestational age.  Pain/Pressure: Absent     Pelvic: Cervical exam deferred        Extremities: Normal range of motion.  Edema: None  Mental Status: Normal mood and affect. Normal behavior. Normal judgment and thought content.   Assessment and Plan:  Pregnancy: G1P0 at [redacted]w[redacted]d 1. Encounter for supervision of normal first pregnancy in second trimester - on PNV and baby ASA  2. Gastroesophageal reflux disease without esophagitis - famotidine (PEPCID) 40 MG tablet; 0.5mg  tablets bid  Dispense: 30 tablet; Refill: 3  3. BMI 32.0-32.9,adult  4. [redacted] weeks gestation of pregnancy - rechekc 4 weeks for 28 week labs/Tdap  Preterm labor symptoms and general obstetric precautions including but not limited to vaginal  bleeding, contractions, leaking of fluid and fetal movement were reviewed in detail with the patient. Please refer to After Visit Summary for other counseling recommendations.   Return in about 4 weeks (around 05/07/2023) for 3rd trimester labs (RPR, HIV, CBC), Tdap.  Future Appointments  Date Time Provider Department Center  05/07/2023  8:35 AM Jerene Bears, MD [facility]  05/28/2023  9:55 AM Courtney Paris, Wilmer Floor, CNM [facility]  06/11/2023 10:35 AM Jerene Bears, MD [facility]  06/25/2023  9:55 AM Jerene Bears, MD [facility]  07/09/2023  9:15 AM Jerene Bears, MD [facility]  07/17/2023  9:55 AM Jerene Bears, MD [facility]  07/24/2023  9:35 AM Jerene Bears, MD [facility]  07/30/2023  9:35 AM Jerene Bears, MD [facility]  08/05/2023  9:55 AM Jerene Bears, MD [facility]    Jerene Bears, MD

## 2023-04-18 ENCOUNTER — Encounter (HOSPITAL_BASED_OUTPATIENT_CLINIC_OR_DEPARTMENT_OTHER): Payer: Self-pay | Admitting: Obstetrics & Gynecology

## 2023-05-07 ENCOUNTER — Ambulatory Visit (HOSPITAL_BASED_OUTPATIENT_CLINIC_OR_DEPARTMENT_OTHER): Payer: BC Managed Care – PPO | Admitting: Obstetrics & Gynecology

## 2023-05-07 VITALS — BP 122/73 | HR 105 | Wt 239.0 lb

## 2023-05-07 DIAGNOSIS — Z23 Encounter for immunization: Secondary | ICD-10-CM

## 2023-05-07 DIAGNOSIS — Z3A27 27 weeks gestation of pregnancy: Secondary | ICD-10-CM | POA: Diagnosis not present

## 2023-05-07 DIAGNOSIS — O99210 Obesity complicating pregnancy, unspecified trimester: Secondary | ICD-10-CM

## 2023-05-07 DIAGNOSIS — Z3402 Encounter for supervision of normal first pregnancy, second trimester: Secondary | ICD-10-CM | POA: Diagnosis not present

## 2023-05-07 DIAGNOSIS — K219 Gastro-esophageal reflux disease without esophagitis: Secondary | ICD-10-CM

## 2023-05-07 NOTE — Progress Notes (Signed)
   PRENATAL VISIT NOTE  Subjective:  Lindsay Barnett is a 27 y.o. G1P0 at [redacted]w[redacted]d being seen today for ongoing prenatal care.  She is currently monitored for the following issues for this low-risk pregnancy and has Supervision of normal first pregnancy; BMI 32.0-32.9,adult; Genetic testing; and Obesity in pregnancy, antepartum on their problem list.  Patient reports no complaints.  Contractions: Not present. Vag. Bleeding: None.  Movement: Present. Denies leaking of fluid.   The following portions of the patient's history were reviewed and updated as appropriate: allergies, current medications, past family history, past medical history, past social history, past surgical history and problem list.   Objective:   Vitals:   05/07/23 0924  BP: 122/73  Pulse: (!) 105  Weight: 239 lb (108.4 kg)    Fetal Status: Fetal Heart Rate (bpm): 140 Fundal Height: 29 cm Movement: Present     General:  Alert, oriented and cooperative. Patient is in no acute distress.  Skin: Skin is warm and dry. No rash noted.   Cardiovascular: Normal heart rate noted  Respiratory: Normal respiratory effort, no problems with respiration noted  Abdomen: Soft, gravid, appropriate for gestational age.  Pain/Pressure: Absent     Pelvic: Cervical exam deferred        Extremities: Normal range of motion.  Edema: None  Mental Status: Normal mood and affect. Normal behavior. Normal judgment and thought content.   Assessment and Plan:  Pregnancy: G1P0 at [redacted]w[redacted]d 1. Encounter for supervision of normal first pregnancy in second trimester - on PNV and baby ASA - Glucose Tolerance, 2 Hours w/1 Hour - CBC - RPR - HIV Antibody (routine testing w rflx) - Tdap vaccine greater than or equal to 7yo IM  2. [redacted] weeks gestation of pregnancy  3. Gastroesophageal reflux disease without esophagitis - using pepcid  4. Obesity in pregnancy, antepartum   Preterm labor symptoms and general obstetric precautions including but not  limited to vaginal bleeding, contractions, leaking of fluid and fetal movement were reviewed in detail with the patient. Please refer to After Visit Summary for other counseling recommendations.   Return in about 4 weeks (around 06/04/2023).  Future Appointments  Date Time Provider Department Center  05/28/2023  9:55 AM Leftwich-Kirby, Wilmer Floor, CNM [facility]  06/11/2023 10:35 AM Jerene Bears, MD [facility]  06/25/2023  9:55 AM Jerene Bears, MD [facility]  07/09/2023  9:15 AM Jerene Bears, MD [facility]  07/17/2023  9:55 AM Jerene Bears, MD [facility]  07/24/2023  9:35 AM Jerene Bears, MD [facility]  07/30/2023  9:35 AM Jerene Bears, MD [facility]  08/05/2023  9:55 AM Jerene Bears, MD [facility]    Jerene Bears, MD

## 2023-05-08 LAB — CBC
Hematocrit: 35.2 % (ref 34.0–46.6)
Hemoglobin: 11.9 g/dL (ref 11.1–15.9)
MCH: 30.7 pg (ref 26.6–33.0)
MCHC: 33.8 g/dL (ref 31.5–35.7)
MCV: 91 fL (ref 79–97)
Platelets: 298 10*3/uL (ref 150–450)
RBC: 3.88 x10E6/uL (ref 3.77–5.28)
RDW: 12.8 % (ref 11.7–15.4)
WBC: 9.7 10*3/uL (ref 3.4–10.8)

## 2023-05-08 LAB — GLUCOSE TOLERANCE, 2 HOURS W/ 1HR
Glucose, 1 hour: 147 mg/dL (ref 70–179)
Glucose, 2 hour: 118 mg/dL (ref 70–152)
Glucose, Fasting: 80 mg/dL (ref 70–91)

## 2023-05-08 LAB — RPR: RPR Ser Ql: NONREACTIVE

## 2023-05-08 LAB — HIV ANTIBODY (ROUTINE TESTING W REFLEX): HIV Screen 4th Generation wRfx: NONREACTIVE

## 2023-05-28 ENCOUNTER — Ambulatory Visit (HOSPITAL_BASED_OUTPATIENT_CLINIC_OR_DEPARTMENT_OTHER): Payer: BC Managed Care – PPO | Admitting: Advanced Practice Midwife

## 2023-05-28 VITALS — BP 118/76 | HR 90 | Wt 241.4 lb

## 2023-05-28 DIAGNOSIS — Z3402 Encounter for supervision of normal first pregnancy, second trimester: Secondary | ICD-10-CM

## 2023-05-28 DIAGNOSIS — Z3A30 30 weeks gestation of pregnancy: Secondary | ICD-10-CM

## 2023-05-28 NOTE — Progress Notes (Signed)
   PRENATAL VISIT NOTE  Subjective:  Lindsay Barnett is a 27 y.o. G1P0 at [redacted]w[redacted]d being seen today for ongoing prenatal care.  She is currently monitored for the following issues for this low-risk pregnancy and has Supervision of normal first pregnancy; BMI 32.0-32.9,adult; Genetic testing; and Obesity in pregnancy, antepartum on their problem list.  Patient reports no complaints.  Contractions: Not present. Vag. Bleeding: None.  Movement: Present. Denies leaking of fluid.   The following portions of the patient's history were reviewed and updated as appropriate: allergies, current medications, past family history, past medical history, past social history, past surgical history and problem list.   Objective:   Vitals:   05/28/23 1011  BP: 118/76  Pulse: 90  Weight: 241 lb 6.4 oz (109.5 kg)    Fetal Status: Fetal Heart Rate (bpm): 135 Fundal Height: 31 cm Movement: Present  Presentation: Vertex  General:  Alert, oriented and cooperative. Patient is in no acute distress.  Skin: Skin is warm and dry. No rash noted.   Cardiovascular: Normal heart rate noted  Respiratory: Normal respiratory effort, no problems with respiration noted  Abdomen: Soft, gravid, appropriate for gestational age.  Pain/Pressure: Absent     Pelvic: Cervical exam deferred        Extremities: Normal range of motion.  Edema: None  Mental Status: Normal mood and affect. Normal behavior. Normal judgment and thought content.   Assessment and Plan:  Pregnancy: G1P0 at [redacted]w[redacted]d 1. Encounter for supervision of normal first pregnancy in second trimester --Anticipatory guidance about next visits/weeks of pregnancy given.  --Questions answered about collecting colostrum, birth planning, etc --Vertex by [place] today, position evaluated at pt request  2. [redacted] weeks gestation of pregnancy   Preterm labor symptoms and general obstetric precautions including but not limited to vaginal bleeding, contractions, leaking of fluid  and fetal movement were reviewed in detail with the patient. Please refer to After Visit Summary for other counseling recommendations.   Return in about 2 weeks (around 06/11/2023) for LOB.  Future Appointments  Date Time Provider Department Center  06/11/2023 10:35 AM Lo, Toma Aran, CNM [facility]  06/25/2023  9:55 AM Lo, Toma Aran, CNM [facility]  07/09/2023  9:15 AM Marton Redwood, Toma Aran, CNM [facility]  07/17/2023  9:55 AM Jerene Bears, MD [facility]  07/24/2023  9:35 AM Jerene Bears, MD [facility]  07/30/2023  9:35 AM Jerene Bears, MD [facility]  08/05/2023  9:55 AM Jerene Bears, MD [facility]    Sharen Counter, CNM

## 2023-06-11 ENCOUNTER — Ambulatory Visit (HOSPITAL_BASED_OUTPATIENT_CLINIC_OR_DEPARTMENT_OTHER): Payer: BC Managed Care – PPO | Admitting: Certified Nurse Midwife

## 2023-06-11 VITALS — BP 124/69 | HR 114 | Wt 245.0 lb

## 2023-06-11 DIAGNOSIS — Z3403 Encounter for supervision of normal first pregnancy, third trimester: Secondary | ICD-10-CM

## 2023-06-11 DIAGNOSIS — O99210 Obesity complicating pregnancy, unspecified trimester: Secondary | ICD-10-CM

## 2023-06-11 DIAGNOSIS — Z3A32 32 weeks gestation of pregnancy: Secondary | ICD-10-CM

## 2023-06-11 DIAGNOSIS — O2603 Excessive weight gain in pregnancy, third trimester: Secondary | ICD-10-CM

## 2023-06-11 DIAGNOSIS — O26843 Uterine size-date discrepancy, third trimester: Secondary | ICD-10-CM

## 2023-06-11 NOTE — Progress Notes (Signed)
    PRENATAL VISIT NOTE  Subjective:  Lindsay Barnett is a 27 y.o. G1P0 at [redacted]w[redacted]d being seen today for ongoing prenatal care.  Having a son. Plans breastfeeding. Has a DEBP. Has a Pediatrician.  She is currently monitored for the following issues for this low-risk pregnancy and has Supervision of normal first pregnancy; Obesity in pregnancy, antepartum; Excessive weight gain during pregnancy in third trimester; and Uterine size-date discrepancy in third trimester on their problem list.  Patient reports no bleeding, no contractions, no cramping, and no leaking.  Contractions: Not present. Vag. Bleeding: None.  Movement: Present. Denies leaking of fluid.   The following portions of the patient's history were reviewed and updated as appropriate: allergies, current medications, past family history, past medical history, past social history, past surgical history and problem list.   Objective:   Vitals:   06/11/23 1032  BP: 124/69  Pulse: (!) 114  Weight: 245 lb (111.1 kg)    Fetal Status: Fetal Heart Rate (bpm): 140 Fundal Height: 35 cm Movement: Present     General:  Alert, oriented and cooperative. Patient is in no acute distress.  Skin: Skin is warm and dry. No rash noted.   Cardiovascular: Normal heart rate noted  Respiratory: Normal respiratory effort, no problems with respiration noted  Abdomen: Soft, gravid, appropriate for gestational age.  Pain/Pressure: Present     Pelvic: Cervical exam deferred        Extremities: Normal range of motion.  Edema: None  Mental Status: Normal mood and affect. Normal behavior. Normal judgment and thought content.   Assessment and Plan:  Pregnancy: G1P0 at [redacted]w[redacted]d 1. Uterine size-date discrepancy in third trimester - [place] MFM OB FOLLOW UP; Future  2. Excessive weight gain during pregnancy in third trimester - [place] MFM OB FOLLOW UP; Future  3. Obesity in pregnancy, antepartum -[place] for growth pending  4. Encounter for supervision of normal first  pregnancy in third trimester -Declined Flu Vaccine -Plans Breastfeeding, education provided  Preterm labor symptoms and general obstetric precautions including but not limited to vaginal bleeding, contractions, leaking of fluid and fetal movement were reviewed in detail with the patient. Please refer to After Visit Summary for other counseling recommendations.   No follow-ups on file.  Future Appointments  Date Time Provider Department Center  06/25/2023  9:55 AM Londynn Sonoda, Toma Aran, CNM [facility]  07/09/2023  9:15 AM Marqueze Ramcharan, Toma Aran, CNM [facility]  07/17/2023  9:55 AM Jerene Bears, MD [facility]  07/24/2023  9:35 AM Jerene Bears, MD [facility]  07/30/2023  9:35 AM Jerene Bears, MD [facility]  08/05/2023  9:55 AM Jerene Bears, MD [facility]    Letta Kocher, CNM

## 2023-06-11 NOTE — Patient Instructions (Addendum)
Breastfeeding Benefits In this video, you will more about breastfeeding and how it can benefit both you and your baby. To view the content, go to this web address: [email or web address]  This video will expire on: 02/03/2025. If you need access to this video following this date, please reach out to the healthcare provider who assigned it to you. This information is not intended to replace advice given to you by your health care provider. Make sure you discuss any questions you have with your health care provider. Elsevier Patient Education  2024 Elsevier Inc.  Breastfeeding Your Newborn Breastfeeding can play an important role in your early moments and days with your new baby. This video will give you an overview of breastfeeding and provide helpful tips on how to get started. To view the content, go to this web address: [email or web address]  This video will expire on: 10/10/2024. If you need access to this video following this date, please reach out to the healthcare provider who assigned it to you. This information is not intended to replace advice given to you by your health care provider. Make sure you discuss any questions you have with your health care provider. Elsevier Patient Education  2024 [employer].  Establishing Your Breastfeeding Routine During the first days and weeks after your baby is born, you'll work toward establishing a daily routine for breastfeeding, both at home and if you return to work. This video will give you some tips to help you find a breastfeeding routine that works for you and your baby. To view the content, go to this web address: [email or web address]  This video will expire on: 02/03/2025. If you need access to this video following this date, please reach out to the healthcare provider who assigned it to you. This information is not intended to replace advice given to you by your health care provider. Make sure you  discuss any questions you have with your health care provider. Elsevier Patient Education  2024 Elsevier Inc.  Helpful Strategies for Successful Breastfeeding In this video, you'll learn some tips to help you breastfeed with success. To view the content, go to this web address: [email or web address]  This video will expire on: 02/03/2025. If you need access to this video following this date, please reach out to the healthcare provider who assigned it to you. This information is not intended to replace advice given to you by your health care provider. Make sure you discuss any questions you have with your health care provider. Elsevier Patient Education  2024 Elsevier Inc.  Latching and Removal for Breastfeeding Breastfeeding can play an important role in your early moments and days with your new baby. This video will help you learn more about latching and removal, which are two key parts of breastfeeding. To view the content, go to this web address: [email or web address]  This video will expire on: 02/03/2025. If you need access to this video following this date, please reach out to the healthcare provider who assigned it to you. This information is not intended to replace advice given to you by your health care provider. Make sure you discuss any questions you have with your health care provider. Elsevier Patient Education  2024 Elsevier Inc.  Overcoming Common Breastfeeding Challenges Breastfeeding has a number of great benefits, but also may have some challenges. This video will teach you how to overcome common breastfeeding challenges. To view the content, go to this web address: [email or web address]  This video will expire on: 02/03/2025. If you need access to this  video following this date, please reach out to the healthcare provider who assigned it to you. This information is not intended to replace advice given to you by your health care  provider. Make sure you discuss any questions you have with your health care provider. Elsevier Patient Education  2024 Elsevier Inc. Breastfeeding and Breast Care It is normal to have some problems when you start to breastfeed your new baby, even if you have breastfed before. But, there are things that you can do to take care of yourself and help prevent problems. Work with your doctor or breastfeeding specialist (Advertising copywriter) to find what works best for you. If you need help, ask for help as soon as possible to prevent problems later on. How does breast care during breastfeeding affect me? If you keep your breasts healthy, feed your baby often, and help your baby attach to your nipples in the right way (good latch), you may avoid these problems: Cracked or sore nipples. Breasts becoming too filled with milk (engorgement). Plugged milk ducts. Low milk supply. Breast swelling or infection. How does breast care during breastfeeding affect my baby? By preventing problems with your breasts, you will help your baby feed well and they will gain the right amount of weight. If your baby does not attach to your nipples the right way this can cause cracked or sore nipples. It can also cause your baby to have problems emptying milk and this can reduce your milk supply. Your baby may also have problems getting enough breast milk during a feeding. What actions can I take to care for myself during breastfeeding? Best ways to breastfeed Always make sure your baby's mouth attaches to your nipple (latches) properly to breastfeed. Make sure that your baby is in a good position. Try different breastfeeding positions to find one that works. Breastfeed when you feel like you need to make your breasts less full or when your baby shows signs of hunger. This is called breastfeeding on demand. Try to relax when it is time to feed your baby. This helps your body release milk from your breast. To help increase  milk flow, do these things before feeding: Remove a small amount of milk from your breast. Use a pump or squeeze with your hand. Apply warm, moist heat to your breast. Do this in the shower or use hand towels soaked with warm water. Massage your breasts. Do this when you are breastfeeding as well. Caring for your breasts  Wear a nursing bra that provides support. Avoid wearing: Tight clothes. Underwire bras. Talitha Givens that put pressure on your breasts. To help your breasts stay healthy and keep them from getting too dry or feeling sore: Try not to use soap on your nipples. Let your nipples air-dry for 3-4 minutes after each feeding. Do not use things like a hair dryer to dry your breasts. Use cotton or disposable bra pads to absorb breast milk that leaks. Change the pads if they become soaked with milk. Put some lanolin on your nipples after breastfeeding. Lanolin is safe for babies and does not need to be washed off before you feed your baby again. Rub some breast milk into your nipples: Use your hand to squeeze out a few drops of breast milk. Gently massage the milk into your nipples. Let your nipples air-dry. Use pads that have gel in them (hydrogel pads) between feedings to provide cooling relief and help heal the skin. If told, put ice on the painful area. Put ice in  a plastic bag. Place a towel between your skin and the bag. Leave the ice on for 20 minutes, 2-3 times a day. If your skin turns bright red, take off the ice right away to prevent skin damage. The risk of damage is higher if you cannot feel pain, heat, or cold. Follow these instructions at home: Eating and drinking Eat a balanced diet. This includes fruits, vegetables, whole grains, lean proteins, and dairy or dairy alternatives. Drink enough fluid to keep your pee (urine) pale yellow. General instructions Rest as much as possible. Nap while your baby is sleeping. Talk to your doctor or breastfeeding specialist before  taking: Your normal medicines. Vitamins, herbs, and supplements. Over-the-counter medicines. Contact a doctor if: You are frustrated with breastfeeding. You are worried your baby is not getting enough milk. Signs of not getting enough milk include: Your baby is not gaining weight. Your baby loses weight. Your baby is more than 49 week old and wetting fewer than 6 diapers in a 24-hour period. You have pain in your breasts or feel uncomfortable. This may include: Continued pain with breastfeeding. Your breasts are too filled with milk, and this lasts longer than 48-72 hours. Cracking or soreness in your nipples that does not get better. Bleeding from your nipples. You have signs of an infection, such as: A fever. Pus-like fluid coming from your nipple. Warm, red areas on your breasts. This information is not intended to replace advice given to you by your health care provider. Make sure you discuss any questions you have with your health care provider. Document Revised: 09/12/2022 Document Reviewed: 08/16/2022 Elsevier Patient Education  2024 Elsevier Inc.  Breastfeeding Tips for a Good Latch The latch is how your baby's mouth attaches to your nipple to breastfeed. A good latch will help your baby to feed and grow well. It will also help establish your milk supply. Breastfeeding can be challenging, especially during the first few weeks after your baby is born. It is normal to have some problems when you start to breastfeed your new baby, even if you have breastfed before. Your baby may have trouble latching due to: Not being in a correct position on the breast. Conditions that affect your baby's mouth, tongue, or lips, such as tongue-tie or cleft lip. The shape of your nipples, such as nipples that are flat, turned in, or very large. Using a bottle or pacifier too early. Your baby being born early (prematurely). Small babies often have a weak suck reflex. Breasts becoming overfilled with  milk (engorgement). Breasts that are too full can make a latch difficult. If this happens, express a little milk to help soften the breast. Work with a breastfeeding specialist (Advertising copywriter) to find positions and strategies that will help your baby have a good latch. How does this affect me? A poor latch may cause you to have problems such as: Cracked or sore nipples. Engorged breasts. Plugged milk ducts. Low milk supply. Breast inflammation or infection. How does this affect my baby? A poor latch may cause your baby to not be able to feed well and have trouble gaining weight. What actions can I take to help my baby have a good latch? How to position your baby Find a comfortable place to sit or lie down. Your neck and back should be well supported. If you are seated, place a pillow or rolled-up blanket under your baby to bring your baby to the level of your breast. Make sure that your baby's belly is  facing your belly. Try different positions to find one that works best for you and your baby. Massage To begin each breastfeeding session, you may find it helpful to gently massage your breast. This may be helpful if your breasts feel full. With your fingertips, massage from your chest wall toward your nipple in a circular motion. This encourages milk flow. It may be helpful to continue this action during feeding. Position Position your breast for a good latch. Support your breast with four fingers underneath and your thumb above your nipple. Keep your fingers away from your nipple and your baby's mouth. How to help your baby latch Follow these steps to help your baby latch: Stroke your baby's lips gently with your finger or nipple. When your baby's mouth is open wide enough, quickly bring your baby to your breast and place your entire nipple, and as much of the areolaas possible, into your baby's mouth. The areola is the colored area around your nipple. Your baby's tongue should be  between the lower gum and your breast. You should be able to see more areola above your baby's upper lip than below the lower lip. When your baby starts sucking, you will feel a gentle pull on your nipple, but you should not feel pain. Be patient. It is common for a baby to suck for about 2-3 minutes to start the flow of breast milk. Make sure that your baby's mouth is correctly positioned around your nipple. Your baby's lips should create a seal on your breast and be turned outward.  Follow these instructions at home: General instructions Look for the following signs that your baby has successfully latched on to your nipple: The baby is quietly tugging or quietly sucking without causing you pain. You hear the baby swallow after every 3 or 4 sucks. You see muscle movement above and in front of the baby's ears while your baby is sucking. Know these signs that your baby has not successfully latched on to your nipple: The baby makes sucking sounds or smacking sounds while nursing. You have nipple pain. If your baby is not latched well, put your little finger between your baby's gums and your nipple to break the seal. Then, help your baby latch again. Get help from a lactation consultant if you need it. Where to find more information Office on Women's Health Breastfeeding: TravelLesson.ca Centers for Disease Control and Prevention: TonerPromos.no [employer] League: llli.org Contact a health care provider if: You are frustrated with breastfeeding. Your baby is frequently too sleepy to feed well or is crying and will not stop. You are worried your baby is not getting enough milk. Signs that your baby is not getting enough milk include: Your baby is not gaining weight or loses weight. Your baby is more than 4 week old and wetting fewer than 6 diapers in a 24-hour period. You have pain or discomfort in your breasts, such as: Continued pain with breastfeeding. Breast engorgement that does not improve  after 48-72 hours. Warm, red areas or painful areas on your breasts. Cracking or soreness in your nipples that does not get better with treatment. Bleeding or pus-like fluid coming from your nipples. You have a fever. This information is not intended to replace advice given to you by your health care provider. Make sure you discuss any questions you have with your health care provider. Document Revised: 09/12/2022 Document Reviewed: 08/16/2022 Elsevier Patient Education  2024 Elsevier Inc.    Benefits of Breastfeeding Breastfeeding has many health  benefits for babies and their breastfeeding parents. Breast milk is the best form of nutrition for babies. Breastfeeding is recommended until a child is 73 years old or older, as long as this is wanted by both the breastfeeding parent and child. Breastfeeding can be challenging, especially during the first few weeks after your baby is born. Ask your health care provider or breastfeeding specialist (lactation consultant) for more information and help if needed. Asking for help early can provide the support you need for successful breastfeeding. Breastfeeding is not the only way to feed your baby breast milk. Some parents choose not to feed their babies directly from the breast. Instead, they pump and bottle-feed their babies expressed breast milk. How do breastfeeding and pumping benefit me? Breastfeeding and pumping may: Help to create a bond between you and your baby. Slow bleeding after you give birth. Help your uterus return to its size before pregnancy. Help you lose the weight gained during pregnancy. Lower your risk of developing type 2 diabetes, weak bones (osteoporosis), rheumatoid arthritis, cardiovascular disease, and some forms of cancer later in life. How does breast milk benefit my baby? Nutrients in breast milk are better for your baby compared to nutrients in infant formula. Breast milk is easier for your baby to digest. The first  milk (colostrum)helps your baby's digestive system function better. Breast milk lowers the risk of problems with the stomach and intestines. This includes necrotizing enterocolitis (NEC) in newborns. NEC is the inflammation and death of tissue in the intestine. Babies born early (premature) are at a higher risk of developing NEC. As your baby grows, your body changes the nutrients in your breast milk to meet your baby's needs. Breast milk improves your baby's brain development. Breast milk lowers your baby's risk for sudden infant death syndrome (SIDS). Breast milk can lower your baby's risk of: Ear infections. Infections of the nose, throat, or airways (respiratory infections). Allergies. Obesity. Diabetes. What are breast milk antibodies? Breast milk antibodies are substances that are part of the body's disease-fighting system (immune system). Antibodies help your baby fight off infections. Breast milk antibodies protect your baby from illnesses that you have: Had yourself. Been vaccinated against. Been exposed to while breastfeeding or pumping. What are other benefits of breastfeeding and bottle-feeding expressed breast milk? Breast milk is free. Breastfeeding may be convenient. In an emergency situation, formula shortage, or natural disaster, you will be able to feed your baby without the need for safe water or infant formula. If you are exclusively pumping, manual breast pumps are available to express breast milk. You will need to boil pump parts to keep them clean between uses. If you do not have access to clean water or a manual breast pump, you can hand express your milk. Do this by compressing and massaging the milk ducts of each breast and catching the breast milk in a bottle or storage container. Where to find more information [employer]: llli.org Breastfeeding, Centers for Disease Control and Prevention (CDC): TonerPromos.no Breastfeeding and Returning to the  Workplace, Marine scientist for Disease Control and Prevention (CDC): TonerPromos.no [employer] of Obstetricians and Gynecologists (ACOG) : acog.org WIC Breastfeeding Support: wicbreastfeeding.fns.usda.gov Contact a health care provider if: You are frustrated with breastfeeding. You are worried your baby is not getting enough milk. Signs of not getting enough milk include: Your baby is not gaining weight or loses weight. Your baby is more than 18 week old and wetting fewer than 6 diapers in a 24-hour period. You do  not hear your baby swallowing during feeds. Your full breasts do not get softer after your baby breastfeeds. Your baby is frequently too sleepy to feed well or is crying and will not stop. You have pain or discomfort in your breasts such as: Continued pain with breastfeeding. Breast engorgement that does not improve after 48-72 hours. Cracking or soreness in your nipples that does not get better with treatment. Bleeding from your nipples. This information is not intended to replace advice given to you by your health care provider. Make sure you discuss any questions you have with your health care provider. Document Revised: 08/16/2022 Document Reviewed: 07/20/2022 Elsevier Patient Education  2024 [employer].

## 2023-06-14 ENCOUNTER — Encounter (HOSPITAL_BASED_OUTPATIENT_CLINIC_OR_DEPARTMENT_OTHER): Payer: Self-pay | Admitting: Certified Nurse Midwife

## 2023-06-25 ENCOUNTER — Encounter (HOSPITAL_BASED_OUTPATIENT_CLINIC_OR_DEPARTMENT_OTHER): Payer: Self-pay | Admitting: Certified Nurse Midwife

## 2023-06-25 ENCOUNTER — Ambulatory Visit (HOSPITAL_BASED_OUTPATIENT_CLINIC_OR_DEPARTMENT_OTHER): Payer: BC Managed Care – PPO | Admitting: Certified Nurse Midwife

## 2023-06-25 VITALS — BP 122/79 | HR 100 | Wt 248.6 lb

## 2023-06-25 DIAGNOSIS — Z3403 Encounter for supervision of normal first pregnancy, third trimester: Secondary | ICD-10-CM

## 2023-06-25 DIAGNOSIS — O26843 Uterine size-date discrepancy, third trimester: Secondary | ICD-10-CM

## 2023-06-25 DIAGNOSIS — Z3A34 34 weeks gestation of pregnancy: Secondary | ICD-10-CM

## 2023-06-25 NOTE — Progress Notes (Signed)
   PRENATAL VISIT NOTE  Subjective:  Lindsay Barnett is a 27 y.o. G1P0 at [redacted]w[redacted]d being seen today for ongoing prenatal care.  She is currently monitored for the following issues for this  pregnancy and has Supervision of normal first pregnancy; Obesity in pregnancy, antepartum; Excessive weight gain during pregnancy in third trimester; and Uterine size-date discrepancy in third trimester on their problem list.  Patient reports no bleeding, no cramping, and no leaking.  Contractions: Not present. Vag. Bleeding: None.  Movement: Present. Denies leaking of fluid.   The following portions of the patient's history were reviewed and updated as appropriate: allergies, current medications, past family history, past medical history, past social history, past surgical history and problem list.   Objective:   Vitals:   06/25/23 1010  BP: 122/79  Pulse: 100  Weight: 248 lb 9.6 oz (112.8 kg)    Fetal Status: Fetal Heart Rate (bpm): 140 Fundal Height: 37 cm Movement: Present  Presentation: Vertex  General:  Alert, oriented and cooperative. Patient is in no acute distress.  Skin: Skin is warm and dry. No rash noted.   Cardiovascular: Normal heart rate noted  Respiratory: Normal respiratory effort, no problems with respiration noted  Abdomen: Soft, gravid, appropriate for gestational age.  Pain/Pressure: Present     Pelvic: Cervical exam deferred        Extremities: Normal range of motion.  Edema: None  Mental Status: Normal mood and affect. Normal behavior. Normal judgment and thought content.   Assessment and Plan:  Pregnancy: G1P0 at [redacted]w[redacted]d  1. Encounter for supervision of normal first pregnancy in third trimester - Pt given information for [employer]. She is considering.    2. Uterine size-date discrepancy in third trimester - Pt has [place] 07/01/23 for EFW/Growth.  Preterm labor symptoms and general obstetric precautions including but not limited to vaginal bleeding,  contractions, leaking of fluid and fetal movement were reviewed in detail with the patient. Please refer to After Visit Summary for other counseling recommendations.   No follow-ups on file.  Future Appointments  Date Time Provider Department Center  07/01/2023  9:15 AM [facility] NURSE [facility]  07/01/2023  9:30 AM [facility] US5 [facility]  07/09/2023  9:15 AM Toshio Slusher, Toma Aran, CNM [facility]  07/17/2023  9:55 AM Jerene Bears, MD [facility]  07/24/2023  9:35 AM Itha Kroeker, Toma Aran, CNM [facility]  07/30/2023  9:35 AM Jerene Bears, MD [facility]  08/05/2023  9:55 AM Jerene Bears, MD [facility]    Letta Kocher, CNM

## 2023-07-01 ENCOUNTER — Ambulatory Visit: Payer: BC Managed Care – PPO | Admitting: *Deleted

## 2023-07-01 ENCOUNTER — Other Ambulatory Visit: Payer: Self-pay | Admitting: *Deleted

## 2023-07-01 ENCOUNTER — Encounter (HOSPITAL_BASED_OUTPATIENT_CLINIC_OR_DEPARTMENT_OTHER): Payer: Self-pay | Admitting: Certified Nurse Midwife

## 2023-07-01 ENCOUNTER — Other Ambulatory Visit: Payer: Self-pay

## 2023-07-01 ENCOUNTER — Ambulatory Visit: Payer: BC Managed Care – PPO | Attending: Certified Nurse Midwife

## 2023-07-01 VITALS — BP 101/55 | HR 90

## 2023-07-01 DIAGNOSIS — O2603 Excessive weight gain in pregnancy, third trimester: Secondary | ICD-10-CM | POA: Diagnosis not present

## 2023-07-01 DIAGNOSIS — O99213 Obesity complicating pregnancy, third trimester: Secondary | ICD-10-CM

## 2023-07-01 DIAGNOSIS — E669 Obesity, unspecified: Secondary | ICD-10-CM

## 2023-07-01 DIAGNOSIS — O26843 Uterine size-date discrepancy, third trimester: Secondary | ICD-10-CM | POA: Diagnosis not present

## 2023-07-01 DIAGNOSIS — O26849 Uterine size-date discrepancy, unspecified trimester: Secondary | ICD-10-CM

## 2023-07-01 DIAGNOSIS — Z3A35 35 weeks gestation of pregnancy: Secondary | ICD-10-CM | POA: Diagnosis not present

## 2023-07-06 ENCOUNTER — Other Ambulatory Visit (HOSPITAL_BASED_OUTPATIENT_CLINIC_OR_DEPARTMENT_OTHER): Payer: Self-pay | Admitting: Obstetrics & Gynecology

## 2023-07-06 DIAGNOSIS — K219 Gastro-esophageal reflux disease without esophagitis: Secondary | ICD-10-CM

## 2023-07-07 ENCOUNTER — Encounter (HOSPITAL_BASED_OUTPATIENT_CLINIC_OR_DEPARTMENT_OTHER): Payer: Self-pay | Admitting: Certified Nurse Midwife

## 2023-07-09 ENCOUNTER — Ambulatory Visit (HOSPITAL_BASED_OUTPATIENT_CLINIC_OR_DEPARTMENT_OTHER): Payer: BC Managed Care – PPO | Admitting: Certified Nurse Midwife

## 2023-07-09 ENCOUNTER — Other Ambulatory Visit (HOSPITAL_COMMUNITY)
Admission: RE | Admit: 2023-07-09 | Discharge: 2023-07-09 | Disposition: A | Discharge status: Home / Self Care | Payer: BC Managed Care – PPO | Source: Ambulatory Visit | Attending: Obstetrics & Gynecology | Admitting: Obstetrics & Gynecology

## 2023-07-09 VITALS — BP 109/82 | HR 96 | Wt 253.8 lb

## 2023-07-09 DIAGNOSIS — Z3A36 36 weeks gestation of pregnancy: Secondary | ICD-10-CM

## 2023-07-09 DIAGNOSIS — Z3403 Encounter for supervision of normal first pregnancy, third trimester: Secondary | ICD-10-CM

## 2023-07-09 NOTE — Progress Notes (Signed)
    PRENATAL VISIT NOTE  Subjective:  Lindsay Barnett is a 27 y.o. G1P0 at [redacted]w[redacted]d being seen today for ongoing prenatal care.  She is currently monitored for the following issues for this  pregnancy and has Supervision of normal first pregnancy; Obesity in pregnancy, antepartum; Excessive weight gain during pregnancy in third trimester; and Uterine size-date discrepancy in third trimester on their problem list.  Patient reports no complaints.  Contractions: Irregular. Vag. Bleeding: None.  Movement: Present. Denies leaking of fluid.   The following portions of the patient's history were reviewed and updated as appropriate: allergies, current medications, past family history, past medical history, past social history, past surgical history and problem list.   Objective:   Vitals:   07/09/23 0954  BP: 109/82  Pulse: 96  Weight: 253 lb 12.8 oz (115.1 kg)    Fetal Status: Fetal Heart Rate (bpm): 140 Fundal Height: 38 cm Movement: Present  Presentation: Vertex  General:  Alert, oriented and cooperative. Patient is in no acute distress.  Skin: Skin is warm and dry. No rash noted.   Cardiovascular: Normal heart rate noted  Respiratory: Normal respiratory effort, no problems with respiration noted  Abdomen: Soft, gravid, appropriate for gestational age.  Pain/Pressure: Present     Pelvic: Cervical exam deferred        Extremities: Normal range of motion.  Edema: None  Mental Status: Normal mood and affect. Normal behavior. Normal judgment and thought content.   Assessment and Plan:  Pregnancy: G1P0 at [redacted]w[redacted]d 1. Encounter for supervision of normal first pregnancy in third trimester - GBS and GC/CT collected - Continue fetal kick counts TID  2. [redacted] weeks gestation of pregnancy   Preterm labor symptoms and general obstetric precautions including but not limited to vaginal bleeding, contractions, leaking of fluid and fetal movement were reviewed in detail with the patient. Please refer to  After Visit Summary for other counseling recommendations.   No follow-ups on file.  Future Appointments  Date Time Provider Department Center  07/17/2023  9:55 AM Jerene Bears, MD [facility]  07/24/2023  9:35 AM Marton Redwood, Toma Aran, CNM [facility]  07/29/2023 11:30 AM [facility] US2 [facility]  07/30/2023  9:35 AM Taiki Buckwalter, Toma Aran, CNM [facility]  08/05/2023  9:55 AM Jerene Bears, MD [facility]    Letta Kocher, CNM

## 2023-07-09 NOTE — Addendum Note (Signed)
Addended by: Ina Homes B on: 07/09/2023 02:02 PM   Modules accepted: Orders

## 2023-07-10 ENCOUNTER — Encounter (HOSPITAL_BASED_OUTPATIENT_CLINIC_OR_DEPARTMENT_OTHER): Payer: Self-pay | Admitting: Certified Nurse Midwife

## 2023-07-10 LAB — CERVICOVAGINAL ANCILLARY ONLY
Chlamydia: NEGATIVE
Comment: NEGATIVE
Comment: NEGATIVE
Comment: NORMAL
Neisseria Gonorrhea: NEGATIVE
Trichomonas: NEGATIVE

## 2023-07-13 LAB — CULTURE, BETA STREP (GROUP B ONLY): Strep Gp B Culture: NEGATIVE

## 2023-07-17 ENCOUNTER — Other Ambulatory Visit (HOSPITAL_BASED_OUTPATIENT_CLINIC_OR_DEPARTMENT_OTHER): Payer: Self-pay

## 2023-07-17 ENCOUNTER — Ambulatory Visit (HOSPITAL_BASED_OUTPATIENT_CLINIC_OR_DEPARTMENT_OTHER): Payer: BC Managed Care – PPO | Admitting: Obstetrics & Gynecology

## 2023-07-17 VITALS — BP 124/75 | HR 93 | Wt 253.4 lb

## 2023-07-17 DIAGNOSIS — O99210 Obesity complicating pregnancy, unspecified trimester: Secondary | ICD-10-CM

## 2023-07-17 DIAGNOSIS — Z3A37 37 weeks gestation of pregnancy: Secondary | ICD-10-CM

## 2023-07-17 DIAGNOSIS — O26843 Uterine size-date discrepancy, third trimester: Secondary | ICD-10-CM

## 2023-07-17 DIAGNOSIS — Z3403 Encounter for supervision of normal first pregnancy, third trimester: Secondary | ICD-10-CM

## 2023-07-17 MED ORDER — ABRYSVO 120 MCG/0.5ML IM SOLR
0.5000 mL | INTRAMUSCULAR | 0 refills | Status: DC
  Filled 2023-07-17: qty 0.5, 1d supply, fill #0

## 2023-07-17 NOTE — Progress Notes (Signed)
   PRENATAL VISIT NOTE  Subjective:  Lindsay Barnett is a 27 y.o. G1P0 at [redacted]w[redacted]d being seen today for ongoing prenatal care.  She is currently monitored for the following issues for this low-risk pregnancy and has Supervision of normal first pregnancy; Obesity in pregnancy, antepartum; Excessive weight gain during pregnancy in third trimester; and Uterine size-date discrepancy in third trimester on their problem list.  Patient reports no complaints.  Contractions: Irregular. Vag. Bleeding: None.  Movement: Present. Denies leaking of fluid.   The following portions of the patient's history were reviewed and updated as appropriate: allergies, current medications, past family history, past medical history, past social history, past surgical history and problem list.   Objective:   Vitals:   07/17/23 1036  BP: 124/75  Pulse: 93  Weight: 253 lb 6.4 oz (114.9 kg)    Fetal Status: Fetal Heart Rate (bpm): 139 Fundal Height: 38 cm Movement: Present  Presentation: Vertex  General:  Alert, oriented and cooperative. Patient is in no acute distress.  Skin: Skin is warm and dry. No rash noted.   Cardiovascular: Normal heart rate noted  Respiratory: Normal respiratory effort, no problems with respiration noted  Abdomen: Soft, gravid, appropriate for gestational age.  Pain/Pressure: Present     Pelvic: Cervical exam performed in the presence of a chaperone Dilation: 1 Effacement (%): 20 Station: -3  Extremities: Normal range of motion.  Edema: None  Mental Status: Normal mood and affect. Normal behavior. Normal judgment and thought content.   Assessment and Plan:  Pregnancy: G1P0 at [redacted]w[redacted]d 1. Encounter for supervision of normal first pregnancy in third trimester - on PNV - taking baby ASA - considering water birth and has class scheduled tomorrow.  She is going to let me know if really has desire for this as needs appt with CNM prior to delivery as well.  2. Obesity in pregnancy,  antepartum  3. [redacted] weeks gestation of pregnancy - Respiratory syncytial virus vaccine, preF, subunit, bivalent,(Abrysvo)  4. Uterine size-date discrepancy in third trimester - has growth scan scheduled for 12/16.  Term labor symptoms and general obstetric precautions including but not limited to vaginal bleeding, contractions, leaking of fluid and fetal movement were reviewed in detail with the patient. Please refer to After Visit Summary for other counseling recommendations.   Return in about 1 week (around 07/24/2023).  Future Appointments  Date Time Provider Department Center  07/24/2023  9:35 AM Lo, Toma Aran, CNM [facility]  07/29/2023 11:30 AM [facility] US2 [facility]  07/30/2023  9:35 AM Lo, Toma Aran, CNM [facility]  08/05/2023  9:55 AM Jerene Bears, MD [facility]    Jerene Bears, MD

## 2023-07-20 ENCOUNTER — Encounter (HOSPITAL_BASED_OUTPATIENT_CLINIC_OR_DEPARTMENT_OTHER): Payer: Self-pay | Admitting: Obstetrics & Gynecology

## 2023-07-24 ENCOUNTER — Encounter (HOSPITAL_BASED_OUTPATIENT_CLINIC_OR_DEPARTMENT_OTHER): Payer: Self-pay | Admitting: Certified Nurse Midwife

## 2023-07-24 ENCOUNTER — Ambulatory Visit (HOSPITAL_BASED_OUTPATIENT_CLINIC_OR_DEPARTMENT_OTHER): Payer: BC Managed Care – PPO | Admitting: Certified Nurse Midwife

## 2023-07-24 VITALS — BP 122/81 | HR 105 | Wt 259.2 lb

## 2023-07-24 DIAGNOSIS — O26843 Uterine size-date discrepancy, third trimester: Secondary | ICD-10-CM

## 2023-07-24 DIAGNOSIS — Z3403 Encounter for supervision of normal first pregnancy, third trimester: Secondary | ICD-10-CM

## 2023-07-24 DIAGNOSIS — Z3A38 38 weeks gestation of pregnancy: Secondary | ICD-10-CM

## 2023-07-25 ENCOUNTER — Encounter (HOSPITAL_BASED_OUTPATIENT_CLINIC_OR_DEPARTMENT_OTHER): Payer: Self-pay | Admitting: Certified Nurse Midwife

## 2023-07-25 NOTE — Progress Notes (Signed)
    PRENATAL VISIT NOTE  Subjective:  Lindsay Barnett is a 27 y.o. G1P0 at [redacted]w[redacted]d being seen today for ongoing prenatal care.  Accompanied by her supportive significant other. She is currently monitored for the following issues for this low-risk pregnancy and has Supervision of normal first pregnancy; Obesity in pregnancy, antepartum; Excessive weight gain during pregnancy in third trimester; and Uterine size-date discrepancy in third trimester on their problem list.  Patient reports no bleeding, no contractions, no cramping, and no leaking.  Contractions: Irregular. Vag. Bleeding: None.  Movement: Present. Denies leaking of fluid.   The following portions of the patient's history were reviewed and updated as appropriate: allergies, current medications, past family history, past medical history, past social history, past surgical history and problem list.   Objective:   Vitals:   07/24/23 0938  BP: 122/81  Pulse: (!) 105  Weight: 259 lb 3.2 oz (117.6 kg)    Fetal Status: Fetal Heart Rate (bpm): 140 Fundal Height: 38 cm Movement: Present  Presentation: Vertex  General:  Alert, oriented and cooperative. Patient is in no acute distress.  Skin: Skin is warm and dry. No rash noted.   Cardiovascular: Normal heart rate noted  Respiratory: Normal respiratory effort, no problems with respiration noted  Abdomen: Soft, gravid, appropriate for gestational age.  Pain/Pressure: Absent     Pelvic: Cervical exam performed in the presence of a chaperone        Extremities: Normal range of motion.  Edema: Trace  Mental Status: Normal mood and affect. Normal behavior. Normal judgment and thought content.   Assessment and Plan:  Pregnancy: G1P0 at [redacted]w[redacted]d  1. Encounter for supervision of normal first pregnancy in third trimester (Primary) - Continue fetal kick counts TID - GBS Negative - [place] (07/01/23): Vtx, posterior placenta, AFI wnl, EFW 6lb 7oz (81%), HC 20%, AC 97%, FL 44%. Normal interval  anatomy with prominent bowel (large bowel measures 14mm). Follow-up in 4 weeks for S>D and prominent fetal bowel (upper limit normal). Next [place] 07/29/23.  2. [redacted] weeks gestation of pregnancy   Term labor symptoms and general obstetric precautions including but not limited to vaginal bleeding, contractions, leaking of fluid and fetal movement were reviewed in detail with the patient. Please refer to After Visit Summary for other counseling recommendations.   No follow-ups on file.  Future Appointments  Date Time Provider Department Center  07/29/2023 11:30 AM [facility] US2 [facility]  07/30/2023  9:35 AM Lindsay Barnett, Toma Aran, CNM [facility]  08/05/2023  9:55 AM Jerene Bears, MD [facility]    Letta Kocher, CNM

## 2023-07-25 NOTE — Patient Instructions (Signed)
Breastfeeding Benefits In this video, you will more about breastfeeding and how it can benefit both you and your baby. To view the content, go to this web address: [email or web address]  This video will expire on: 02/03/2025. If you need access to this video following this date, please reach out to the healthcare provider who assigned it to you.   Breastfeeding Your Newborn Breastfeeding can play an important role in your early moments and days with your new baby. This video will give you an overview of breastfeeding and provide helpful tips on how to get started. To view the content, go to this web address: [email or web address]  This video will expire on: 10/10/2024. If you need access to this video following this date, please reach out to the healthcare provider who assigned it to you. This information is not intended to replace advice given to you by your health care provider. Make sure you discuss any questions you have with your health care provider. Elsevier Patient Education  2024 [employer].  This information is not intended to replace advice given to you by your health care provider. Make sure you discuss any questions you have with your health care provider. Elsevier Patient Education  2024 Elsevier Inc.   Common Breastfeeding Positions This video show you some of the most common breastfeeding positions and how to do them. To view the content, go to this web address: [email or web address]  This video will expire on: 02/03/2025. If you need access to this video following this date, please reach out to the healthcare provider who assigned it to you. This information is not intended to replace advice given to you by your health care provider. Make sure you discuss any questions you have with your health care provider. Elsevier Patient Education  2024 [employer].  Establishing Your Breastfeeding Routine During the first days and weeks  after your baby is born, you'll work toward establishing a daily routine for breastfeeding, both at home and if you return to work. This video will give you some tips to help you find a breastfeeding routine that works for you and your baby. To view the content, go to this web address: [email or web address]  This video will expire on: 02/03/2025. If you need access to this video following this date, please reach out to the healthcare provider who assigned it to you. This information is not intended to replace advice given to you by your health care provider. Make sure you discuss any questions you have with your health care provider. Elsevier Patient Education  2024 Elsevier Inc.   Helpful Strategies for Successful Breastfeeding In this video, you'll learn some tips to help you breastfeed with success. To view the content, go to this web address: [email or web address]  This video will expire on: 02/03/2025. If you need access to this video following this date, please reach out to the healthcare provider who assigned it to you. This information is not intended to replace advice given to you by your health care provider. Make sure you discuss any questions you have with your health care provider. Elsevier Patient Education  2024 [employer].

## 2023-07-29 ENCOUNTER — Ambulatory Visit: Payer: BC Managed Care – PPO

## 2023-07-29 ENCOUNTER — Other Ambulatory Visit: Payer: Self-pay | Admitting: Maternal & Fetal Medicine

## 2023-07-29 ENCOUNTER — Other Ambulatory Visit: Payer: Self-pay

## 2023-07-29 ENCOUNTER — Encounter (HOSPITAL_COMMUNITY): Payer: Self-pay | Admitting: Obstetrics & Gynecology

## 2023-07-29 ENCOUNTER — Inpatient Hospital Stay (HOSPITAL_COMMUNITY)
Admission: AD | Admit: 2023-07-29 | Discharge: 2023-07-31 | DRG: 807 | Disposition: A | Discharge status: Home / Self Care | Payer: BC Managed Care – PPO | Attending: Obstetrics and Gynecology | Admitting: Obstetrics and Gynecology

## 2023-07-29 DIAGNOSIS — O99214 Obesity complicating childbirth: Principal | ICD-10-CM | POA: Diagnosis present

## 2023-07-29 DIAGNOSIS — Z3A39 39 weeks gestation of pregnancy: Secondary | ICD-10-CM

## 2023-07-29 DIAGNOSIS — O26893 Other specified pregnancy related conditions, third trimester: Secondary | ICD-10-CM | POA: Diagnosis not present

## 2023-07-29 DIAGNOSIS — E66813 Obesity, class 3: Secondary | ICD-10-CM | POA: Diagnosis not present

## 2023-07-29 DIAGNOSIS — Z8249 Family history of ischemic heart disease and other diseases of the circulatory system: Secondary | ICD-10-CM

## 2023-07-29 DIAGNOSIS — Z833 Family history of diabetes mellitus: Secondary | ICD-10-CM

## 2023-07-29 DIAGNOSIS — E669 Obesity, unspecified: Secondary | ICD-10-CM | POA: Diagnosis not present

## 2023-07-29 DIAGNOSIS — Z7982 Long term (current) use of aspirin: Secondary | ICD-10-CM | POA: Diagnosis not present

## 2023-07-29 DIAGNOSIS — O26849 Uterine size-date discrepancy, unspecified trimester: Secondary | ICD-10-CM

## 2023-07-29 DIAGNOSIS — O26843 Uterine size-date discrepancy, third trimester: Secondary | ICD-10-CM

## 2023-07-29 DIAGNOSIS — Z3490 Encounter for supervision of normal pregnancy, unspecified, unspecified trimester: Secondary | ICD-10-CM

## 2023-07-29 DIAGNOSIS — O99213 Obesity complicating pregnancy, third trimester: Secondary | ICD-10-CM

## 2023-07-29 DIAGNOSIS — Z3403 Encounter for supervision of normal first pregnancy, third trimester: Principal | ICD-10-CM

## 2023-07-29 LAB — CBC
HCT: 33.5 % — ABNORMAL LOW (ref 36.0–46.0)
Hemoglobin: 11 g/dL — ABNORMAL LOW (ref 12.0–15.0)
MCH: 27.3 pg (ref 26.0–34.0)
MCHC: 32.8 g/dL (ref 30.0–36.0)
MCV: 83.1 fL (ref 80.0–100.0)
Platelets: 344 10*3/uL (ref 150–400)
RBC: 4.03 MIL/uL (ref 3.87–5.11)
RDW: 13.8 % (ref 11.5–15.5)
WBC: 9.8 10*3/uL (ref 4.0–10.5)
nRBC: 0 % (ref 0.0–0.2)

## 2023-07-29 LAB — TYPE AND SCREEN
ABO/RH(D): O POS
Antibody Screen: NEGATIVE

## 2023-07-29 MED ORDER — FLEET ENEMA RE ENEM: 1.0000 | ENEMA | RECTAL | Status: DC | PRN

## 2023-07-29 MED ORDER — ACETAMINOPHEN 325 MG PO TABS: 650.0000 mg | ORAL_TABLET | ORAL | Status: DC | PRN

## 2023-07-29 MED ORDER — LACTATED RINGERS IV SOLN
500.0000 mL | INTRAVENOUS | Status: DC | PRN
  Administered 2023-07-30 (×2): 500 mL via INTRAVENOUS

## 2023-07-29 MED ORDER — LIDOCAINE HCL (PF) 1 % IJ SOLN: 30.0000 mL | INTRAMUSCULAR | Status: DC | PRN

## 2023-07-29 MED ORDER — MISOPROSTOL 50MCG HALF TABLET
50.0000 ug | ORAL_TABLET | Freq: Once | ORAL | Status: AC
  Administered 2023-07-29: 50 ug via ORAL
  Filled 2023-07-29: qty 1

## 2023-07-29 MED ORDER — OXYCODONE-ACETAMINOPHEN 5-325 MG PO TABS: 2.0000 | ORAL_TABLET | ORAL | Status: DC | PRN

## 2023-07-29 MED ORDER — OXYTOCIN BOLUS FROM INFUSION
333.0000 mL | Freq: Once | INTRAVENOUS | Status: AC
  Administered 2023-07-30: 333 mL via INTRAVENOUS

## 2023-07-29 MED ORDER — LACTATED RINGERS IV SOLN: INTRAVENOUS | Status: DC

## 2023-07-29 MED ORDER — ONDANSETRON HCL 4 MG/2ML IJ SOLN
4.0000 mg | Freq: Four times a day (QID) | INTRAMUSCULAR | Status: DC | PRN
  Administered 2023-07-29: 4 mg via INTRAVENOUS
  Filled 2023-07-29: qty 2

## 2023-07-29 MED ORDER — OXYTOCIN-SODIUM CHLORIDE 30-0.9 UT/500ML-% IV SOLN: 2.5000 [IU]/h | INTRAVENOUS | Status: DC

## 2023-07-29 MED ORDER — OXYCODONE-ACETAMINOPHEN 5-325 MG PO TABS: 1.0000 | ORAL_TABLET | ORAL | Status: DC | PRN

## 2023-07-29 MED ORDER — SOD CITRATE-CITRIC ACID 500-334 MG/5ML PO SOLN
30.0000 mL | ORAL | Status: DC | PRN
  Administered 2023-07-30: 30 mL via ORAL
  Filled 2023-07-29: qty 30

## 2023-07-29 NOTE — H&P (Signed)
OBSTETRIC ADMISSION HISTORY AND PHYSICAL  Lindsay Barnett is a 27 y.o. female G1P0 with IUP at [redacted]w[redacted]d by ultrasound presenting for IOL secondary to abnormal BPP 4/8 with movement and fetal tone not observed, otherwise normal AFI 13.8, LVP 5.12. She reports +FMs, No LOF, no VB, no blurry vision, headaches or peripheral edema, and RUQ pain.  She plans on breastfeeding. She does not desire hormonal birth control at this time. She received her prenatal care at [facility].  Dating: By ultrasound at [redacted]w[redacted]d --->  Estimated Date of Delivery: 08/04/23  Sono:   @[redacted]w[redacted]d , CWD, normal anatomy, cephalic presentation, posterior placenta, 3805g, 78% EFW  Prenatal History/Complications: Obesity in pregnancy  Past Medical History: Past Medical History:  Diagnosis Date   Abnormal Thyroid Function Testing    BMI 33.0-33.9,adult    Chronic bilateral low back pain without sciatica 07/10/2022   Paresthesias 07/10/2022    Past Surgical History: Past Surgical History:  Procedure Laterality Date   NO PAST SURGERIES     None      Obstetrical History: OB History     Gravida  1   Para      Term      Preterm      AB      Living         SAB      IAB      Ectopic      Multiple      Live Births              Social History Social History   Socioeconomic History   Marital status: Single    Spouse name: Not on file   Number of children: Not on file   Years of education: Not on file   Highest education level: Not on file  Occupational History   Occupation: [employer]  Tobacco Use   Smoking status: Never   Smokeless tobacco: Never  Vaping Use   Vaping status: Never Used  Substance and Sexual Activity   Alcohol use: No    Alcohol/week: 0.0 standard drinks of alcohol   Drug use: No   Sexual activity: Yes    Birth control/protection: None  Other Topics Concern   Not on file  Social History Narrative   Single.   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   Enjoys  playing on the computer, cooking.    Social Drivers of Corporate investment banker Strain: Low Risk  (12/26/2022)   Overall Financial Resource Strain (CARDIA)    Difficulty of Paying Living Expenses: Not hard at all  Food Insecurity: No Food Insecurity (07/29/2023)   Hunger Vital Sign    Worried About Running Out of Food in the Last Year: Never true    Ran Out of Food in the Last Year: Never true  Transportation Needs: No Transportation Needs (07/29/2023)   PRAPARE - Administrator, Civil Service (Medical): No    Lack of Transportation (Non-Medical): No  Physical Activity: Sufficiently Active (12/26/2022)   Exercise Vital Sign    Days of Exercise per Week: 5 days    Minutes of Exercise per Session: 60 min  Stress: No Stress Concern Present (12/26/2022)   [employer] of Occupational Health - Occupational Stress Questionnaire    Feeling of Stress : Not at all  Social Connections: Moderately Integrated (12/26/2022)   Social Connection and Isolation Panel [NHANES]    Frequency of Communication with Friends and Family: More than three times a  week    Frequency of Social Gatherings with Friends and Family: Twice a week    Attends Religious Services: More than 4 times per year    Active Member of [employer] or Organizations: No    Attends Engineer, structural: Never    Marital Status: Living with partner    Family History: Family History  Problem Relation Age of Onset   Hypertension Mother    Breast cancer Mother 75       contralateral breast cancer dx. 8   Other Mother        PALB2+   Prostate cancer Father 80   Narcolepsy Brother    Breast cancer Maternal Aunt 64       PALB2+   Heart disease Maternal Grandmother    Heart attack Maternal Grandmother    Diabetes Maternal Grandfather    Cancer Maternal Grandfather    Lung cancer Maternal Grandfather 14   Prostate cancer Paternal Grandfather 38 - 89   Pancreatic cancer Maternal Great-grandfather 26 - 5        maternal grandmother's father   Ovarian cancer Maternal Great-grandmother 65       maternal grandmother's mother   Asthma Neg Hx     Allergies: No Known Allergies  Medications Prior to Admission  Medication Sig Dispense Refill Last Dose/Taking   aspirin EC 81 MG tablet Take 1 tablet (81 mg total) by mouth daily. Swallow whole. 30 tablet 12 07/29/2023   famotidine (PEPCID) 40 MG tablet TAKE 1/2 TABLET BY MOUTH TWICE DAILY 90 tablet 1 07/28/2023   Prenatal MV & Min w/FA-DHA (PRENATAL GUMMIES PO) Take by mouth.   07/29/2023   RSV bivalent vaccine (ABRYSVO) 120 MCG/0.5ML injection Inject 0.5 mLs into the muscle. (Patient not taking: Reported on 07/24/2023) 0.5 mL 0      Review of Systems   All systems reviewed and negative except as stated in HPI  Blood pressure 124/68, pulse (!) 121, temperature 98.6 F (37 C), temperature source Oral, resp. rate 16, height 5\' 6"  (1.676 m), weight 118.3 kg, last menstrual period 10/10/2022. General appearance: alert and no distress Lungs: clear to auscultation bilaterally Heart: regular rate and rhythm Abdomen: soft, non-tender; bowel sounds normal Pelvic: deferred Extremities: Homans sign is negative, no sign of DVT Presentation: cephalic via BSUS Fetal monitoring: Baseline: 140 bpm, Variability: Good {> 6 bpm), and Accelerations: Reactive Uterine activity: None    Prenatal labs: ABO, Rh: --/--/O POS (12/16 1428) Antibody: NEG (12/16 1428) Rubella: 3.91 (05/29 1307) RPR: Non Reactive (09/24 0836)  HBsAg: Negative (05/29 1307)  HIV: Non Reactive (09/24 0836)  GBS: Negative/-- (11/26 0239)  2 hr Glucola normal, early A1c 5.0 Genetic screening: low risk NIPS, AFP negative Anatomy US unremarkable  Prenatal Transfer Tool  Maternal Diabetes: No Genetic Screening: Normal Maternal Ultrasounds/Referrals: Normal Fetal Ultrasounds or other Referrals:  None Maternal Substance Abuse:  No Significant Maternal Medications:  None Significant  Maternal Lab Results:  None Number of Prenatal Visits:greater than 3 verified prenatal visits Other Comments:  None  Results for orders placed or performed during the hospital encounter of 07/29/23 (from the past 24 hours)  Type and screen [facility]   Collection Time: 07/29/23  2:28 PM  Result Value Ref Range   ABO/RH(D) O POS    Antibody Screen NEG    Sample Expiration      08/01/2023,2359 Performed at [facility], [address] [place], [place] [address]   CBC   Collection  Time: 07/29/23  2:29 PM  Result Value Ref Range   WBC 9.8 4.0 - 10.5 K/uL   RBC 4.03 3.87 - 5.11 MIL/uL   Hemoglobin 11.0 (L) 12.0 - 15.0 g/dL   HCT 72.5 (L) 36.6 - 44.0 %   MCV 83.1 80.0 - 100.0 fL   MCH 27.3 26.0 - 34.0 pg   MCHC 32.8 30.0 - 36.0 g/dL   RDW 34.7 42.5 - 95.6 %   Platelets 344 150 - 400 K/uL   nRBC 0.0 0.0 - 0.2 %    Patient Active Problem List   Diagnosis Date Noted   Pregnancy 07/29/2023   Excessive weight gain during pregnancy in third trimester 06/11/2023   Uterine size-date discrepancy in third trimester 06/11/2023   Obesity in pregnancy, antepartum 01/09/2023   Supervision of normal first pregnancy 12/26/2022    Assessment/Plan:  TANAIRY HOLDSWORTH is a 27 y.o. G1P0 at [redacted]w[redacted]d here for induction of labor due to abnormal BPP of 4/8.   #Labor: Discussed options for induction including cytotec, FB, AROM, Pitocin. Starting with buccal Cytotec. Continue expectant management.  #Pain: Discussed options. Patient declines epidural.  #FWB: BPP 4/8 . Cat I tracing. #ID: GBS Neg #MOF: Breastfeeding #MOC: Barrier #Circ: No  Madelyn Brunner, MD  PGY-1 Family Medicine 07/29/23 4:11 PM   Attestation of Supervision of Student:  I confirm that I have verified the information documented in the resident's note and that I have also personally performed the history, physical exam and all medical decision making activities.  I have verified that all services and  findings are accurately documented in this student's note; and I agree with management and plan as outlined in the documentation. I have also made any necessary editorial changes.  Joanne Gavel, MD OB Fellow 07/29/2023 4:26 PM

## 2023-07-29 NOTE — Progress Notes (Signed)
Labor Progress Note Lindsay Barnett is a 27 y.o. G1P0 at [redacted]w[redacted]d presented for IOL for BPP 4/8 S: Coping well. Ample time for questions, concerns, and answers. Discussed role, risk and benefits of FB vs AROM depending on SVE; pt agreeable to either.   O:  BP 111/69   Pulse 95   Temp 98.6 F (37 C) (Oral)   Resp 16   Ht 5\' 6"  (1.676 m)   Wt 118.3 kg   LMP 10/10/2022 (Approximate)   BMI 42.11 kg/m  EFM: 135/moderate variability/accels present/no decels  CVE: Dilation: 1.5 Effacement (%): 50 Station: -3 Presentation: Vertex Exam by:: Dr. Rayna Sexton   A&P: 27 y.o. G1P0 [redacted]w[redacted]d admitted for IOL #Labor: Progressing well. FB placed with 43ml.  #Pain: Coping well. Desires to deliver unmedicated.  #FWB: Cat I #GBS negative   Wyn Forster, MD 10:16 PM

## 2023-07-30 ENCOUNTER — Inpatient Hospital Stay (HOSPITAL_COMMUNITY): Payer: BC Managed Care – PPO | Admitting: Anesthesiology

## 2023-07-30 ENCOUNTER — Other Ambulatory Visit (HOSPITAL_BASED_OUTPATIENT_CLINIC_OR_DEPARTMENT_OTHER): Payer: Self-pay | Admitting: Certified Nurse Midwife

## 2023-07-30 ENCOUNTER — Encounter (HOSPITAL_COMMUNITY): Payer: Self-pay | Admitting: Obstetrics & Gynecology

## 2023-07-30 ENCOUNTER — Encounter (HOSPITAL_BASED_OUTPATIENT_CLINIC_OR_DEPARTMENT_OTHER): Payer: BC Managed Care – PPO | Admitting: Certified Nurse Midwife

## 2023-07-30 ENCOUNTER — Encounter (HOSPITAL_BASED_OUTPATIENT_CLINIC_OR_DEPARTMENT_OTHER): Payer: Self-pay | Admitting: Certified Nurse Midwife

## 2023-07-30 DIAGNOSIS — O99214 Obesity complicating childbirth: Secondary | ICD-10-CM

## 2023-07-30 DIAGNOSIS — Z3A39 39 weeks gestation of pregnancy: Secondary | ICD-10-CM

## 2023-07-30 LAB — RPR: RPR Ser Ql: NONREACTIVE

## 2023-07-30 MED ORDER — PHENYLEPHRINE 80 MCG/ML (10ML) SYRINGE FOR IV PUSH (FOR BLOOD PRESSURE SUPPORT)
80.0000 ug | PREFILLED_SYRINGE | INTRAVENOUS | Status: DC | PRN
  Filled 2023-07-30: qty 10

## 2023-07-30 MED ORDER — PHENYLEPHRINE 80 MCG/ML (10ML) SYRINGE FOR IV PUSH (FOR BLOOD PRESSURE SUPPORT)
80.0000 ug | PREFILLED_SYRINGE | INTRAVENOUS | Status: DC | PRN
Start: 2023-07-30 — End: 2023-07-30
  Administered 2023-07-30: 80 ug via INTRAVENOUS

## 2023-07-30 MED ORDER — IBUPROFEN 800 MG PO TABS
800.0000 mg | ORAL_TABLET | Freq: Three times a day (TID) | ORAL | Status: DC
  Administered 2023-07-30 – 2023-07-31 (×3): 800 mg via ORAL
  Filled 2023-07-30 (×3): qty 1

## 2023-07-30 MED ORDER — LACTATED RINGERS IV SOLN
500.0000 mL | Freq: Once | INTRAVENOUS | Status: AC
Start: 2023-07-30 — End: 2023-07-30
  Administered 2023-07-30: 500 mL via INTRAVENOUS

## 2023-07-30 MED ORDER — FENTANYL-BUPIVACAINE-NACL 0.5-0.125-0.9 MG/250ML-% EP SOLN
12.0000 mL/h | EPIDURAL | Status: DC | PRN
  Filled 2023-07-30: qty 250

## 2023-07-30 MED ORDER — EPHEDRINE 5 MG/ML INJ: 10.0000 mg | INTRAVENOUS | Status: DC | PRN

## 2023-07-30 MED ORDER — PRENATAL MULTIVITAMIN CH
1.0000 | ORAL_TABLET | Freq: Every day | ORAL | Status: DC
  Administered 2023-07-31: 1 via ORAL
  Filled 2023-07-30: qty 1

## 2023-07-30 MED ORDER — LIDOCAINE HCL (PF) 1 % IJ SOLN
INTRAMUSCULAR | Status: DC | PRN
  Administered 2023-07-30: 5 mL via EPIDURAL

## 2023-07-30 MED ORDER — SODIUM CHLORIDE 0.9% FLUSH
10.0000 mL | Freq: Two times a day (BID) | INTRAVENOUS | Status: DC
Start: 2023-07-30 — End: 2023-08-01

## 2023-07-30 MED ORDER — ONDANSETRON HCL 4 MG PO TABS: 4.0000 mg | ORAL_TABLET | ORAL | Status: DC | PRN

## 2023-07-30 MED ORDER — OXYTOCIN-SODIUM CHLORIDE 30-0.9 UT/500ML-% IV SOLN
1.0000 m[IU]/min | INTRAVENOUS | Status: DC
  Administered 2023-07-30: 2 m[IU]/min via INTRAVENOUS
  Filled 2023-07-30: qty 500

## 2023-07-30 MED ORDER — DIPHENHYDRAMINE HCL 50 MG/ML IJ SOLN: 12.5000 mg | INTRAMUSCULAR | Status: DC | PRN

## 2023-07-30 MED ORDER — TERBUTALINE SULFATE 1 MG/ML IJ SOLN: 0.2500 mg | Freq: Once | INTRAMUSCULAR | Status: DC | PRN

## 2023-07-30 MED ORDER — COCONUT OIL OIL: 1.0000 | TOPICAL_OIL | Status: DC | PRN

## 2023-07-30 MED ORDER — SENNOSIDES-DOCUSATE SODIUM 8.6-50 MG PO TABS
2.0000 | ORAL_TABLET | ORAL | Status: DC
  Administered 2023-07-31: 2 via ORAL
  Filled 2023-07-30: qty 2

## 2023-07-30 MED ORDER — DIPHENHYDRAMINE HCL 25 MG PO CAPS: 25.0000 mg | ORAL_CAPSULE | Freq: Four times a day (QID) | ORAL | Status: DC | PRN

## 2023-07-30 MED ORDER — SODIUM CHLORIDE 0.9% FLUSH: 3.0000 mL | INTRAVENOUS | Status: DC | PRN

## 2023-07-30 MED ORDER — FENTANYL CITRATE (PF) 100 MCG/2ML IJ SOLN
50.0000 ug | INTRAMUSCULAR | Status: DC | PRN
  Administered 2023-07-30: 50 ug via INTRAVENOUS
  Filled 2023-07-30: qty 2

## 2023-07-30 MED ORDER — BENZOCAINE-MENTHOL 20-0.5 % EX AERO: 1.0000 | INHALATION_SPRAY | CUTANEOUS | Status: DC | PRN

## 2023-07-30 MED ORDER — FENTANYL CITRATE (PF) 100 MCG/2ML IJ SOLN
INTRAMUSCULAR | Status: DC | PRN
  Administered 2023-07-30: 12 ug via INTRAVENOUS

## 2023-07-30 MED ORDER — SIMETHICONE 80 MG PO CHEW: 80.0000 mg | CHEWABLE_TABLET | ORAL | Status: DC | PRN

## 2023-07-30 MED ORDER — DIBUCAINE (PERIANAL) 1 % EX OINT: 1.0000 | TOPICAL_OINTMENT | CUTANEOUS | Status: DC | PRN

## 2023-07-30 MED ORDER — ONDANSETRON HCL 4 MG/2ML IJ SOLN: 4.0000 mg | INTRAMUSCULAR | Status: DC | PRN

## 2023-07-30 MED ORDER — ACETAMINOPHEN 325 MG PO TABS: 650.0000 mg | ORAL_TABLET | ORAL | Status: DC | PRN

## 2023-07-30 MED ORDER — FAMOTIDINE 20 MG PO TABS
20.0000 mg | ORAL_TABLET | ORAL | Status: AC
Start: 2023-07-30 — End: 2023-07-30
  Administered 2023-07-30: 20 mg via ORAL
  Filled 2023-07-30: qty 1

## 2023-07-30 MED ORDER — FAMOTIDINE 200 MG/20ML IV SOLN
10.0000 mg | Freq: Once | INTRAVENOUS | Status: DC
  Filled 2023-07-30 (×2): qty 1

## 2023-07-30 MED ORDER — SODIUM CHLORIDE 0.9% FLUSH
3.0000 mL | Freq: Two times a day (BID) | INTRAVENOUS | Status: DC
  Administered 2023-07-31: 3 mL via INTRAVENOUS

## 2023-07-30 MED ORDER — WITCH HAZEL-GLYCERIN EX PADS: 1.0000 | MEDICATED_PAD | CUTANEOUS | Status: DC | PRN

## 2023-07-30 NOTE — Discharge Summary (Shared)
Postpartum Discharge Summary  Date of Service updated***     Patient Name: Lindsay Barnett DOB: 13-Sep-1995 MRN: [id number]  Date of admission: 07/29/2023 Delivery date:07/30/2023 Delivering provider: Sundra Aland Date of discharge: 07/30/2023  Admitting diagnosis: Pregnancy [Z34.90] Intrauterine pregnancy: [redacted]w[redacted]d     Secondary diagnosis:  Principal Problem:   SVD (spontaneous vaginal delivery) Active Problems:   Pregnancy  Additional problems: ***    Discharge diagnosis: Term Pregnancy Delivered                                              Post partum procedures:{Postpartum procedures:23558} Augmentation: AROM, Cytotec, and IP Foley Complications: None  Hospital course: Induction of Labor With Vaginal Delivery   27 y.o. yo G1P0 at 106w2d was admitted to the hospital 07/29/2023 for induction of labor.  Indication for induction:  BPP of 4/8 .  Patient had an labor course complicated by none Membrane Rupture Time/Date: 8:55 AM,07/30/2023  Delivery Method:Vaginal, Spontaneous Operative Delivery:N/A Episiotomy: None Lacerations:  1st degree Details of delivery can be found in separate delivery note.  Patient had a postpartum course complicated by***. Patient is discharged home 07/30/23.  Newborn Data: Birth date:07/30/2023 Birth time:3:35 PM Gender:Female Living status:Living Apgars:8 ,9  Weight:3400 g  Magnesium Sulfate received: No BMZ received: No Rhophylac:N/A MMR:N/A T-DaP:Given prenatally Flu: No RSV Vaccine received: No Transfusion:No  Immunizations received: Immunization History  Administered Date(s) Administered   HPV 9-valent 01/31/2021, 04/05/2021, 10/18/2021   PFIZER(Purple Top)SARS-COV-2 Vaccination 12/19/2019, 01/07/2020   Rsv, Bivalent, Protein Subunit Rsvpref,pf Verdis Frederickson) 07/17/2023   Tdap 01/31/2021, 05/07/2023    Physical exam  Vitals:   07/30/23 1618 07/30/23 1630 07/30/23 1651 07/30/23 1701  BP: 132/71 (!) 125/92 97/69 107/69   Pulse:   (!) 101 96  Resp:      Temp:      TempSrc:      SpO2:      Weight:      Height:       General: alert, cooperative, and no distress Lochia: appropriate Uterine Fundus: firm Incision: N/A DVT Evaluation: {Exam; dvt:2111122} Labs: Lab Results  Component Value Date   WBC 9.8 07/29/2023   HGB 11.0 (L) 07/29/2023   HCT 33.5 (L) 07/29/2023   MCV 83.1 07/29/2023   PLT 344 07/29/2023      Latest Ref Rng & Units 03/16/2022   11:32 AM  CMP  Glucose 70 - 99 mg/dL 82   BUN 6 - 23 mg/dL 9   Creatinine 4.09 - 8.11 mg/dL 9.14   Sodium 782 - 956 mEq/L 139   Potassium 3.5 - 5.1 mEq/L 3.9   Chloride 96 - 112 mEq/L 106   CO2 19 - 32 mEq/L 26   Calcium 8.4 - 10.5 mg/dL 9.4   Total Protein 6.0 - 8.3 g/dL 7.2   Total Bilirubin 0.2 - 1.2 mg/dL 0.4   Alkaline Phos 39 - 117 U/L 70   AST 0 - 37 U/L 15   ALT 0 - 35 U/L 16    Edinburgh Score:     No data to display         No data recorded  After visit meds:  Allergies as of 07/30/2023   No Known Allergies   Med Rec must be completed prior to using this [facility]***        Discharge home in stable condition Infant  Feeding: Breast Infant Disposition:home with mother Discharge instruction: per After Visit Summary and Postpartum booklet. Activity: Advance as tolerated. Pelvic rest for 6 weeks.  Diet: routine diet Future Appointments: Future Appointments  Date Time Provider Department Center  09/10/2023 10:15 AM Lo, Toma Aran, CNM [facility]   Follow up Visit: Message sent to [facility] 12/17  Please schedule this patient for a In person postpartum visit in 4 weeks with the following provider: Any provider. Additional Postpartum F/U: None   Low risk pregnancy complicated by: excessive weight gain, abnl BPP at 39w Delivery mode:  Vaginal, Spontaneous Anticipated Birth Control:  Unsure   07/30/2023 Sundra Aland, MD

## 2023-07-30 NOTE — Lactation Note (Signed)
This note was copied from a baby's chart. Lactation Consultation Note  Patient Name: Boy Amilyah Woehl [id number] Date: 07/30/2023 Age:26 hours Reason for consult: Initial assessment;1st time breastfeeding;Term MOB requested LC services in L&D, LC observed MOB nipples see below assessment and did reverse pressure softening prior to latch to help evert nipple shaft out more. MOB will need hand pump size 21 mm breast flange once on MBU to help evert nipple shaft out prior to latch. MOB latched infant on her right breast using the football hold position with support pillows, infant sustained his latch and was still BF after 15 minutes when LC left the room. MOB will continue to BF infant by cues, on demand, every 2-3 hours, skin to skin. MOB knows to ask for further latch assistance on MBU if needed.   Maternal Data Does the patient have breastfeeding experience prior to this delivery?: No  Feeding Mother's Current Feeding Choice: Breast Milk  LATCH Score Latch: Grasps breast easily, tongue down, lips flanged, rhythmical sucking.  Audible Swallowing: Spontaneous and intermittent  Type of Nipple: Flat  Comfort (Breast/Nipple): Soft / non-tender  Hold (Positioning): Assistance needed to correctly position infant at breast and maintain latch.  LATCH Score: 8   Lactation Tools Discussed/Used    Interventions Interventions: Assisted with latch;Skin to skin;Reverse pressure;Breast compression;Adjust position;Support pillows;Position options;Education  Discharge    Consult Status Consult Status: Follow-up from L&D Follow-up type: In-patient    Frederico Hamman 07/30/2023, 4:52 PM

## 2023-07-30 NOTE — Progress Notes (Signed)
L&D Note  07/30/2023 - 8:57 AM  27 y.o. G1P0 [redacted]w[redacted]d. Pregnancy complicated by nothing  Patient Active Problem List   Diagnosis Date Noted   Pregnancy 07/29/2023   Excessive weight gain during pregnancy in third trimester 06/11/2023   Uterine size-date discrepancy in third trimester 06/11/2023   Obesity in pregnancy, antepartum 01/09/2023   Supervision of normal first pregnancy 12/26/2022    Lindsay Barnett is admitted for IOL for 4/8 BPP   Subjective:  Feeling well. Not uncomfortable and feels occasional UCs  Objective:   Vitals:   07/30/23 0254 07/30/23 0546 07/30/23 0631 07/30/23 0803  BP: 114/72 121/72 113/65 (!) 104/54  Pulse: 97 92 93 84  Resp: 18 17 16 16   Temp: 98.5 F (36.9 C) 98.5 F (36.9 C)    TempSrc: Oral Oral    SpO2: 98%     Weight:      Height:        Current Vital Signs 24h Vital Sign Ranges  T 98.5 F (36.9 C) Temp  Avg: 98.6 F (37 C)  Min: 98.5 F (36.9 C)  Max: 98.8 F (37.1 C)  BP (!) 104/54 BP  Min: 104/54  Max: 127/68  HR 84 Pulse  Avg: 96  Min: 84  Max: 121  RR 16 Resp  Avg: 16.3  Min: 15  Max: 18  SaO2 98 % Room Air SpO2  Avg: 98 %  Min: 98 %  Max: 98 %       24 Hour I/O Current Shift I/O  Time Ins Outs No intake/output data recorded. No intake/output data recorded.   FHR: 130 baseline, +?accels, no decel, mod variability Toco: q2-49m Gen: NAD SVE: 3.5/50/vertex to 0 station, patient amenable to AROM>>clear fluid  Labs:  Recent Labs  Lab 07/29/23 1429  WBC 9.8  HGB 11.0*  HCT 33.5*  PLT 344    Medications Current Facility-Administered Medications  Medication Dose Route Frequency Provider Last Rate Last Admin   acetaminophen (TYLENOL) tablet 650 mg  650 mg Oral Q4H PRN Joanne Gavel, MD       lactated ringers infusion 500-1,000 mL  500-1,000 mL Intravenous PRN Joanne Gavel, MD 999 mL/hr at 07/30/23 0759 500 mL at 07/30/23 0759   lactated ringers infusion   Intravenous Continuous Joanne Gavel, MD 125  mL/hr at 07/30/23 0537 New Bag at 07/30/23 0537   lidocaine (PF) (XYLOCAINE) 1 % injection 30 mL  30 mL Subcutaneous PRN Joanne Gavel, MD       ondansetron [facility]) injection 4 mg  4 mg Intravenous Q6H PRN Joanne Gavel, MD   4 mg at 07/29/23 2242   oxyCODONE-acetaminophen (PERCOCET/ROXICET) 5-325 MG per tablet 1 tablet  1 tablet Oral Q4H PRN Joanne Gavel, MD       oxyCODONE-acetaminophen (PERCOCET/ROXICET) 5-325 MG per tablet 2 tablet  2 tablet Oral Q4H PRN Joanne Gavel, MD       oxytocin (PITOCIN) IV BOLUS FROM BAG  333 mL Intravenous Once Joanne Gavel, MD       oxytocin (PITOCIN) IV infusion 30 units in NS 500 mL - Premix  2.5 Units/hr Intravenous Continuous Joanne Gavel, MD       oxytocin (PITOCIN) IV infusion 30 units in NS 500 mL - Premix  1-40 milli-units/min Intravenous Titrated Wyn Forster, MD 6 mL/hr at 07/30/23 0830 6 milli-units/min at 07/30/23 0830   sodium citrate-citric acid (ORACIT) solution 30 mL  30 mL Oral Q2H PRN Earlene Plater,  Tennis Must, MD   30 mL at 07/30/23 0250   sodium phosphate (FLEET) enema 1 enema  1 enema Rectal PRN Joanne Gavel, MD       terbutaline (BRETHINE) injection 0.25 mg  0.25 mg Subcutaneous Once PRN Wyn Forster, MD        Assessment & Plan:  Patient doing well *Pregnancy: routine care, category I tracing *Labor: continue pitocin per protocol *GBS: negative *Analgesia: no epidural planned by patient.   Cornelia Copa MD Attending Center for [facility])

## 2023-07-30 NOTE — Anesthesia Procedure Notes (Signed)
Epidural Patient location during procedure: OB Start time: 07/30/2023 11:10 AM End time: 07/30/2023 11:29 AM  Staffing Anesthesiologist: Trevor Iha, MD Performed: anesthesiologist   Preanesthetic Checklist Completed: patient identified, IV checked, site marked, risks and benefits discussed, surgical consent, monitors and equipment checked, pre-op evaluation and timeout performed  Epidural Patient position: sitting Prep: DuraPrep and site prepped and draped Patient monitoring: continuous pulse ox and blood pressure Approach: midline Location: L3-L4 Injection technique: LOR air  Needle:  Needle type: Tuohy  Needle gauge: 17 G Needle length: 9 cm and 9 Needle insertion depth: 7 cm Catheter type: closed end flexible Catheter size: 19 Gauge Catheter at skin depth: 13 cm Test dose: negative  Assessment Events: blood not aspirated, cerebrospinal fluid, injection not painful, no injection resistance, no paresthesia and negative IV test  Additional Notes Patient identified. Risks/Benefits/Options discussed with patient including but not limited to bleeding, infection, nerve damage, paralysis, failed block, incomplete pain control, headache, blood pressure changes, nausea, vomiting, reactions to medication both or allergic, itching and postpartum back pain. Confirmed with bedside nurse the patient's most recent platelet count. Confirmed with patient that they are not currently taking any anticoagulation, have any bleeding history or any family history of bleeding disorders. Patient expressed understanding and wished to proceed. All questions were answered. Sterile technique was used throughout the entire procedure. Please see nursing notes for vital signs. Test dose was given through epidural needle and negative prior to continuing to dose epidural or start infusion. Warning signs of high block given to the patient including shortness of breath, tingling/numbness in hands, complete  motor block, or any concerning symptoms with instructions to call for help. Patient was given instructions on fall risk and not to get out of bed. All questions and concerns addressed with instructions to call with any issues.  + csf w 1st attempt  at L2-3 . Needle removed .attempt at L3-4 successful (S) . Patient tolerated procedure well.

## 2023-07-30 NOTE — Progress Notes (Signed)
Labor Progress Note Lindsay Barnett is a 27 y.o. G1P0 at [redacted]w[redacted]d presented for IOL for BPP 4/8 S: Coping well. FB out. Requesting some time prior to next intervention.   O:  BP 109/62   Pulse 85   Temp 98.8 F (37.1 C) (Oral)   Resp 15   Ht 5\' 6"  (1.676 m)   Wt 118.3 kg   LMP 10/10/2022 (Approximate)   BMI 42.11 kg/m  EFM: 125/moderate variability/accels present/no decels  CVE: Dilation: 3 Effacement (%): 50 Station: -3 Presentation: Vertex Exam by:: Blue, RN   A&P: 27 y.o. G1P0 [redacted]w[redacted]d admitted for IOL #Labor: Progressing well. Will reassess in an hour or two for next steps, likely AROM and Pitocin.  #Pain: Coping well. #FWB: Cat I #GBS negative   Wyn Forster, MD 3:41 AM

## 2023-07-30 NOTE — Anesthesia Preprocedure Evaluation (Signed)
Anesthesia Evaluation  Patient identified by MRN, date of birth, ID band Patient awake    Reviewed: Allergy & Precautions, NPO status , Patient's Chart, lab work & pertinent test results  Airway Mallampati: II  TM Distance: >3 FB Neck ROM: Full    Dental no notable dental hx. (+) Teeth Intact, Dental Advisory Given   Pulmonary neg pulmonary ROS   Pulmonary exam normal breath sounds clear to auscultation       Cardiovascular negative cardio ROS Normal cardiovascular exam Rhythm:Regular Rate:Normal     Neuro/Psych negative neurological ROS  negative psych ROS   GI/Hepatic negative GI ROS, Neg liver ROS,,,  Endo/Other    Class 3 obesity  Renal/GU negative Renal ROS     Musculoskeletal negative musculoskeletal ROS (+)    Abdominal   Peds  Hematology Lab Results      Component                Value               Date                      WBC                      9.8                 07/29/2023                HGB                      11.0 (L)            07/29/2023                HCT                      33.5 (L)            07/29/2023                MCV                      83.1                07/29/2023                PLT                      344                 07/29/2023              Anesthesia Other Findings   Reproductive/Obstetrics (+) Pregnancy                             Anesthesia Physical Anesthesia Plan  ASA: 3  Anesthesia Plan: Epidural   Post-op Pain Management:    Induction:   PONV Risk Score and Plan:   Airway Management Planned:   Additional Equipment: None  Intra-op Plan:   Post-operative Plan:   Informed Consent: I have reviewed the patients History and Physical, chart, labs and discussed the procedure including the risks, benefits and alternatives for the proposed anesthesia with the patient or authorized representative who has indicated his/her understanding  and acceptance.       Plan Discussed with:   Anesthesia Plan  Comments: (39.2 Primagravda w BMI 42.1 for LEA)       Anesthesia Quick Evaluation

## 2023-07-31 ENCOUNTER — Other Ambulatory Visit (HOSPITAL_BASED_OUTPATIENT_CLINIC_OR_DEPARTMENT_OTHER): Payer: Self-pay | Admitting: Certified Nurse Midwife

## 2023-07-31 MED ORDER — ACETAMINOPHEN 325 MG PO TABS: 650.0000 mg | ORAL_TABLET | ORAL | 1 refills | Status: DC | PRN

## 2023-07-31 MED ORDER — IBUPROFEN 800 MG PO TABS: 800.0000 mg | ORAL_TABLET | Freq: Three times a day (TID) | ORAL | 0 refills | Status: DC

## 2023-07-31 NOTE — Lactation Note (Signed)
This note was copied from a baby's chart. Lactation Consultation Note  Patient Name: Lindsay Barnett'Y Date: 07/31/2023 Age:26 hours  P1 , 1st time BF,  As LC entered the room , mom feeding the baby a bottle. ( 8 ml ) .  Baby still awake and LC offered to see if the baby would latch and mom receptive. LC changed a wet diaper, and placed baby STS on the right breast, football position. Depth achieved and swallows noted.  Baby released on his own and the nipple well rounded. Per mom the feeding was comfortable.  Latch score 8.  LC reviewed the importance of giving the baby the opportunity to have practice latching. LC reminded parents baby latched well after birth and he knows what to do.  LC reviewed feeding with feeding cues and by 3 hours offer the breast.    Maternal Data    Feeding Nipple Type: Slow - flow  LATCH Score Latch: Grasps breast easily, tongue down, lips flanged, rhythmical sucking.  Audible Swallowing: A few with stimulation  Type of Nipple: Everted at rest and after stimulation  Comfort (Breast/Nipple): Soft / non-tender  Hold (Positioning): Assistance needed to correctly position infant at breast and maintain latch.  LATCH Score: 8   Lactation Tools Discussed/Used Pump Education: Milk Storage  Interventions Interventions: Breast feeding basics reviewed;Assisted with latch;Skin to skin;Breast massage;Hand express;Breast compression;Adjust position;Support pillows;Position options;Education;CDC Guidelines for Breast Pump Cleaning;LC Services brochure  Discharge Pump: Personal;Hands Free  Consult Status Consult Status: Follow-up Date: 08/01/23 Follow-up type: In-patient    Lindsay Barnett 07/31/2023, 10:28 AM

## 2023-07-31 NOTE — Progress Notes (Addendum)
POSTPARTUM PROGRESS NOTE  Subjective: Lindsay Barnett is a 27 y.o. G1P1001 s/p SVD at [redacted]w[redacted]d.  She reports she is doing well. No acute events overnight. She denies any problems with ambulating, voiding or po intake. Denies nausea or vomiting. She has  passed flatus. Pain is well controlled.  Lochia is slightly heavier than a normal period.  Objective: Blood pressure (!) 105/55, pulse 79, temperature 98.2 F (36.8 C), temperature source Oral, resp. rate 18, height 5\' 6"  (1.676 m), weight 118.3 kg, last menstrual period 10/10/2022, SpO2 (P) 98%, unknown if currently breastfeeding.  Physical Exam:  General: alert, cooperative and no distress Chest: no respiratory distress Abdomen: soft, non-tender  Uterine Fundus: firm and at level of umbilicus Extremities: No calf swelling or tenderness  1+ pitting edema bilaterally  Recent Labs    07/29/23 1429  HGB 11.0*  HCT 33.5*    Assessment/Plan: TAMAR DANTUONO is a 27 y.o. G1P1001 s/p SVD at [redacted]w[redacted]d for abnormal BPP.  Routine Postpartum Care: Doing well, pain well-controlled.  -- Continue routine care, lactation support  -- Contraception: Condom -- Feeding: breast  Dispo: Plan for discharge today/tomorrow.  Madelyn Brunner, MD 07/31/2023 7:59 AM

## 2023-08-01 NOTE — Anesthesia Postprocedure Evaluation (Signed)
Anesthesia Post Note  Patient: Lindsay Barnett  Procedure(s) Performed: AN AD HOC LABOR EPIDURAL     Patient location during evaluation: Mother Baby Anesthesia Type: Epidural Level of consciousness: awake and alert Pain management: pain level controlled Vital Signs Assessment: post-procedure vital signs reviewed and stable Respiratory status: spontaneous breathing, nonlabored ventilation and respiratory function stable Cardiovascular status: stable Postop Assessment: no headache, no backache and epidural receding Anesthetic complications: no   No notable events documented.  Last Vitals: There were no vitals filed for this visit.  Last Pain: There were no vitals filed for this visit. Pain Goal:                   Trevor Iha

## 2023-08-02 ENCOUNTER — Encounter (HOSPITAL_BASED_OUTPATIENT_CLINIC_OR_DEPARTMENT_OTHER): Payer: Self-pay | Admitting: Certified Nurse Midwife

## 2023-08-04 ENCOUNTER — Inpatient Hospital Stay (HOSPITAL_COMMUNITY): Admit: 2023-08-04 | Payer: BC Managed Care – PPO

## 2023-08-05 ENCOUNTER — Encounter (HOSPITAL_BASED_OUTPATIENT_CLINIC_OR_DEPARTMENT_OTHER): Payer: Self-pay | Admitting: Certified Nurse Midwife

## 2023-08-05 ENCOUNTER — Encounter (HOSPITAL_BASED_OUTPATIENT_CLINIC_OR_DEPARTMENT_OTHER): Payer: BC Managed Care – PPO | Admitting: Obstetrics & Gynecology

## 2023-08-05 ENCOUNTER — Ambulatory Visit (INDEPENDENT_AMBULATORY_CARE_PROVIDER_SITE_OTHER): Payer: BC Managed Care – PPO | Admitting: Certified Nurse Midwife

## 2023-08-06 ENCOUNTER — Encounter (HOSPITAL_BASED_OUTPATIENT_CLINIC_OR_DEPARTMENT_OTHER): Payer: Self-pay | Admitting: Certified Nurse Midwife

## 2023-08-06 ENCOUNTER — Telehealth (HOSPITAL_COMMUNITY): Payer: Self-pay | Admitting: *Deleted

## 2023-08-06 NOTE — Telephone Encounter (Signed)
Attempted hospital discharge follow-up call. Left message for patient to return RN call with any questions or concerns. Deforest Hoyles, RN, 08/06/23, [phone]

## 2023-08-08 NOTE — Progress Notes (Signed)
Subjective:     Lindsay Barnett is a 27 y.o. female who presents for a postpartum visit. She is 9 days postpartum following a spontaneous vaginal delivery. I have fully reviewed the prenatal and intrapartum course. The delivery was at 39 gestational weeks. Outcome: spontaneous vaginal delivery. Patient experiencing some mild ankle edema bilaterally.   The following portions of the patient's history were reviewed and updated as appropriate: allergies, current medications, past family history, past medical history, past social history, past surgical history, and problem list.  Review of Systems Pertinent items are noted in HPI.   Objective:    BP 126/74 (BP Location: Left Arm, Patient Position: Sitting, Cuff Size: Large)   Pulse 69   Ht 5\' 6"  (1.676 m)   Wt 247 lb (112 kg)   LMP 10/10/2022 (Approximate)   Breastfeeding Yes   BMI 39.87 kg/m   General:  alert, cooperative, and appears stated age           Abdomen: soft, non-tender; bowel sounds normal; no masses,  no organomegaly   Vulva:    Vagina:   Cervix:    Corpus:   Adnexa:    Rectal Exam:         Assessment:    G1P1001 Term SVD of a viable female "[name]" here for BP check  Postpartum depression screen negative  Plan:   BP within normal range today. Headache has resolved. Pt will monitor BP and swelling and will call with questions, concerns or complaints. Letta Kocher

## 2023-08-26 ENCOUNTER — Encounter (HOSPITAL_BASED_OUTPATIENT_CLINIC_OR_DEPARTMENT_OTHER): Payer: Self-pay | Admitting: Certified Nurse Midwife

## 2023-09-10 ENCOUNTER — Ambulatory Visit (HOSPITAL_BASED_OUTPATIENT_CLINIC_OR_DEPARTMENT_OTHER): Payer: BC Managed Care – PPO | Admitting: Certified Nurse Midwife

## 2023-09-10 ENCOUNTER — Encounter (HOSPITAL_BASED_OUTPATIENT_CLINIC_OR_DEPARTMENT_OTHER): Payer: Self-pay | Admitting: Certified Nurse Midwife

## 2023-09-10 NOTE — Progress Notes (Signed)
Subjective:     Lindsay Barnett is a 28 y.o. female 6 weeks postpartum "[name]" SVD at 90w2.  I have fully reviewed the prenatal and intrapartum course. The delivery was at 39 gestational weeks. Outcome: spontaneous vaginal delivery. Anesthesia: epidural. Postpartum course has been uneventful. Baby's course has been uneventful. Baby is feeding by bottle. Bleeding no bleeding. Bowel function is normal. Bladder function is normal. Patient is sexually active. Contraception method is condoms. Postpartum depression screening: negative.  The following portions of the patient's history were reviewed and updated as appropriate: allergies, current medications, past family history, past medical history, past social history, past surgical history, and problem list.  Review of Systems Pertinent items are noted in HPI.   Objective:    Ht 5\' 6"  (1.676 m)   Wt 247 lb 9.6 oz (112.3 kg)   LMP 10/10/2022 (Approximate)   Breastfeeding No   BMI 39.96 kg/m   General:  alert, cooperative, and appears stated age   Breasts:    Lungs:   Heart:    Abdomen: soft, non-tender; bowel sounds normal; no masses,  no organomegaly   Vulva:  normal  Vagina: normal vagina, no discharge, exudate, lesion, or erythema  Cervix:    Corpus: normal size, contour, position, consistency, mobility, non-tender  Adnexa:    Rectal Exam: Not performed.        Assessment:    G1P1 Term SVD "[name]" here for routine  postpartum exam. Pap smear not due  at today's visit.   Plan:    1. Contraception: condoms 2. Pt declines other contraception. Postpartum depression screen negative. 3. Follow up in: 1  year   or as needed.   Letta Kocher

## 2023-09-13 ENCOUNTER — Telehealth (HOSPITAL_BASED_OUTPATIENT_CLINIC_OR_DEPARTMENT_OTHER): Payer: Self-pay | Admitting: *Deleted

## 2023-09-13 NOTE — Telephone Encounter (Signed)
Called patient and left message to please call back with fax number for FML paperwork.

## 2023-09-16 ENCOUNTER — Encounter (HOSPITAL_BASED_OUTPATIENT_CLINIC_OR_DEPARTMENT_OTHER): Payer: Self-pay | Admitting: Certified Nurse Midwife

## 2023-09-17 ENCOUNTER — Encounter (HOSPITAL_BASED_OUTPATIENT_CLINIC_OR_DEPARTMENT_OTHER): Payer: Self-pay | Admitting: Certified Nurse Midwife

## 2023-10-03 ENCOUNTER — Encounter (HOSPITAL_BASED_OUTPATIENT_CLINIC_OR_DEPARTMENT_OTHER): Payer: Self-pay | Admitting: Certified Nurse Midwife

## 2024-02-06 ENCOUNTER — Ambulatory Visit (INDEPENDENT_AMBULATORY_CARE_PROVIDER_SITE_OTHER): Admitting: Primary Care

## 2024-02-06 ENCOUNTER — Encounter: Payer: Self-pay | Admitting: Primary Care

## 2024-02-06 ENCOUNTER — Ambulatory Visit: Payer: Self-pay | Admitting: Primary Care

## 2024-02-06 VITALS — BP 122/78 | HR 98 | Temp 97.9°F | Ht 66.0 in | Wt 257.0 lb

## 2024-02-06 DIAGNOSIS — Z309 Encounter for contraceptive management, unspecified: Secondary | ICD-10-CM | POA: Insufficient documentation

## 2024-02-06 DIAGNOSIS — Z0000 Encounter for general adult medical examination without abnormal findings: Secondary | ICD-10-CM | POA: Insufficient documentation

## 2024-02-06 DIAGNOSIS — Z30011 Encounter for initial prescription of contraceptive pills: Secondary | ICD-10-CM | POA: Diagnosis not present

## 2024-02-06 LAB — POCT URINE PREGNANCY: Preg Test, Ur: NEGATIVE

## 2024-02-06 MED ORDER — NORETHINDRONE ACET-ETHINYL EST 1-20 MG-MCG PO TABS
1.0000 | ORAL_TABLET | Freq: Every day | ORAL | 3 refills | Status: AC
Start: 2024-02-06 — End: ?

## 2024-02-06 NOTE — Assessment & Plan Note (Signed)
 Urine pregnancy test negative today.  Start Junel 1/20 OCPs daily. Discussed instructions for starting and for missed pills.

## 2024-02-06 NOTE — Progress Notes (Signed)
 Subjective:    Patient ID: Lindsay Barnett, female    DOB: 04-Oct-1995, 28 y.o.   MRN: [id number]  HPI  Lindsay Barnett is a very pleasant 28 y.o. female who presents today for complete physical and follow up of chronic conditions.  She would like to resume her birth control.  She is not breast-feeding.  LMP was 01/28/2024  Immunizations: -Tetanus: Completed in 2024  Diet: Fair diet.  Exercise: No regular exercise. Some walking   Eye exam: Completed >1 year ago Dental exam: Completed >1 year ago   Pap Smear: Completed in 2024    Review of Systems  Constitutional:  Negative for unexpected weight change.  HENT:  Negative for rhinorrhea.   Respiratory:  Negative for cough and shortness of breath.   Cardiovascular:  Negative for chest pain.  Gastrointestinal:  Negative for constipation and diarrhea.  Genitourinary:  Negative for difficulty urinating and menstrual problem.  Musculoskeletal:  Negative for arthralgias and myalgias.  Skin:  Negative for rash.  Allergic/Immunologic: Negative for environmental allergies.  Neurological:  Negative for dizziness, numbness and headaches.  Psychiatric/Behavioral:  The patient is not nervous/anxious.          Past Medical History:  Diagnosis Date   Abnormal Thyroid  Function Testing    BMI 33.0-33.9,adult    Chronic bilateral low back pain without sciatica 07/10/2022   Paresthesias 07/10/2022    Social History   Socioeconomic History   Marital status: Single    Spouse name: Not on file   Number of children: Not on file   Years of education: Not on file   Highest education level: Bachelor's degree (e.g., BA, AB, BS)  Occupational History   Occupation: [employer]  Tobacco Use   Smoking status: Never   Smokeless tobacco: Never  Vaping Use   Vaping status: Never Used  Substance and Sexual Activity   Alcohol use: No    Alcohol/week: 0.0 standard drinks of alcohol   Drug use: No   Sexual activity: Yes    Birth  control/protection: None  Other Topics Concern   Not on file  Social History Narrative   Single.   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   Enjoys playing on the computer, cooking.    Social Drivers of Corporate investment banker Strain: Low Risk  (02/05/2024)   Overall Financial Resource Strain (CARDIA)    Difficulty of Paying Living Expenses: Not very hard  Food Insecurity: No Food Insecurity (02/05/2024)   Hunger Vital Sign    Worried About Running Out of Food in the Last Year: Never true    Ran Out of Food in the Last Year: Never true  Transportation Needs: No Transportation Needs (02/05/2024)   PRAPARE - Administrator, Civil Service (Medical): No    Lack of Transportation (Non-Medical): No  Physical Activity: Insufficiently Active (02/05/2024)   Exercise Vital Sign    Days of Exercise per Week: 3 days    Minutes of Exercise per Session: 30 min  Stress: No Stress Concern Present (02/05/2024)   [employer] of Occupational Health - Occupational Stress Questionnaire    Feeling of Stress: Not at all  Social Connections: Moderately Integrated (02/05/2024)   Social Connection and Isolation Panel    Frequency of Communication with Friends and Family: Twice a week    Frequency of Social Gatherings with Friends and Family: Once a week    Attends Religious Services: More than 4 times per year  Active Member of Clubs or Organizations: No    Attends Banker Meetings: Not on file    Marital Status: Living with partner  Intimate Partner Violence: Not At Risk (07/29/2023)   Humiliation, Afraid, Rape, and Kick questionnaire    Fear of Current or Ex-Partner: No    Emotionally Abused: No    Physically Abused: No    Sexually Abused: No    Past Surgical History:  Procedure Laterality Date   NO PAST SURGERIES     None      Family History  Problem Relation Age of Onset   Hypertension Mother    Breast cancer Mother 84       contralateral  breast cancer dx. 46   Other Mother        PALB2+   Prostate cancer Father 66   Narcolepsy Brother    Breast cancer Maternal Aunt 64       PALB2+   Heart disease Maternal Grandmother    Heart attack Maternal Grandmother    Diabetes Maternal Grandfather    Cancer Maternal Grandfather    Lung cancer Maternal Grandfather 67   Prostate cancer Paternal Grandfather 2 - 89   Pancreatic cancer Maternal Great-grandfather 41 - 9       maternal grandmother's father   Ovarian cancer Maternal Great-grandmother 55       maternal grandmother's mother   Asthma Neg Hx     No Known Allergies  No current outpatient medications on file prior to visit.   No current facility-administered medications on file prior to visit.    BP 122/78   Pulse 98   Temp 97.9 F (36.6 C) (Temporal)   Ht 5' 6 (1.676 m)   Wt 257 lb (116.6 kg)   LMP 01/28/2024   SpO2 98%   Breastfeeding No   BMI 41.48 kg/m  Objective:   Physical Exam HENT:     Right Ear: Tympanic membrane and ear canal normal.     Left Ear: Tympanic membrane and ear canal normal.   Eyes:     Pupils: Pupils are equal, round, and reactive to light.    Cardiovascular:     Rate and Rhythm: Normal rate and regular rhythm.  Pulmonary:     Effort: Pulmonary effort is normal.     Breath sounds: Normal breath sounds.  Abdominal:     General: Bowel sounds are normal.     Palpations: Abdomen is soft.     Tenderness: There is no abdominal tenderness.   Musculoskeletal:        General: Normal range of motion.     Cervical back: Neck supple.   Skin:    General: Skin is warm and dry.   Neurological:     Mental Status: She is alert and oriented to person, place, and time.     Cranial Nerves: No cranial nerve deficit.     Deep Tendon Reflexes:     Reflex Scores:      Patellar reflexes are 2+ on the right side and 2+ on the left side.  Psychiatric:        Mood and Affect: Mood normal.           Assessment & Plan:   Preventative health care Assessment & Plan: Immunizations UTD. Pap smear UTD.  Discussed the importance of a healthy diet and regular exercise in order for weight loss, and to reduce the risk of further co-morbidity.  Exam stable. Labs pending.  Follow up in  1 year for repeat physical.    Encounter for initial prescription of contraceptive pills Assessment & Plan: Urine pregnancy test negative today.  Start Junel 1/20 OCPs daily. Discussed instructions for starting and for missed pills.   Orders: -     POCT urine pregnancy -     Norethindrone  Acet-Ethinyl Est; Take 1 tablet by mouth daily.  Dispense: 84 tablet; Refill: 3        Comer MARLA Gaskins, NP

## 2024-02-06 NOTE — Patient Instructions (Signed)
Take your first birth control pill on the Sunday after your period starts. For example, if you start your period on Monday, then take the birth control pill that following Sunday. If you start your period on Sunday, then take your first pill that same day.  You must take your pill at the same time each day.  If you miss a pill then take the pill as soon as you remember, and also take your pill at the regularly scheduled time, even if this means taking two pills in one day. If you miss more than two pills consecutively, then please call me.  It may take three to six months for birth control pills to regulate your cycle and improve symptoms.  It was a pleasure to see you today!   

## 2024-02-06 NOTE — Assessment & Plan Note (Signed)
Immunizations UTD. Pap smear UTD  Discussed the importance of a healthy diet and regular exercise in order for weight loss, and to reduce the risk of further co-morbidity.  Exam stable. Labs pending.  Follow up in 1 year for repeat physical.  

## 2024-06-29 NOTE — Telephone Encounter (Signed)
 Looks like pt as able to schedule OV tomorrow at 10:20 with Dr Bennett.

## 2024-06-30 ENCOUNTER — Ambulatory Visit

## 2024-06-30 VITALS — BP 104/80 | HR 71 | Temp 98.9°F | Ht 66.0 in | Wt 243.0 lb

## 2024-06-30 DIAGNOSIS — M545 Low back pain, unspecified: Secondary | ICD-10-CM

## 2024-06-30 MED ORDER — IBUPROFEN 800 MG PO TABS: 800.0000 mg | ORAL_TABLET | Freq: Three times a day (TID) | ORAL | 0 refills | Status: AC | PRN

## 2024-06-30 NOTE — Patient Instructions (Addendum)
 Thank you for visiting [facility] today! Here's what we talked about: - Call physical therapy in 1wk if no word from them  - Use motrin  as needed for pain

## 2024-06-30 NOTE — Progress Notes (Signed)
 Subjective:   This visit was conducted in person. The patient gave informed consent to the use of [employer]bridge AI technology to record the contents of the encounter as documented below.   Patient ID: Lindsay Barnett, female    DOB: 09-27-1995, 28 y.o.   MRN: [id number]   Discussed the use of AI scribe software for clinical note transcription with the patient, who gave verbal consent to proceed.  History of Present Illness Lindsay Barnett is a 28 year old female who presents with low back pain.  She has been experiencing low back pain for the past five to six days without any specific incident or movement triggering it. The pain is primarily located in the lower back, particularly on the left side, and sometimes radiates across the lower back. It is constant and can prevent her from standing up straight or walking comfortably. She rates the pain at a 4 out of 10, escalating to a 7 or 8 during flare-ups. Movements such as bending down and squatting aggravate the pain. She has been using a heating pad for relief, which provides some comfort, and has tried taking Tylenol , specifically the muscle aches and pains formulation, at the recommended dosage of two tablets, but it did not alleviate the pain.  No radiation of pain down her legs, numbness, or tingling in the legs. She feels some weakness in her knees, but not directly related to her back pain. No fever, chills, or loss of bowel or bladder control. There is no history of back injuries, and she had a previous episode of back pain documented in 2023, which resolved and was not chronic. She reports that this episode is different from her previous episode of back pain.  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 She has a history of upper back pain characterized by a knot at the top of her back, but the current episode is more focused on the lower back.      Review of  Systems  All other systems reviewed and are negative.       No Known Allergies  Current Outpatient Medications on File Prior to Visit  Medication Sig Dispense Refill   norethindrone -ethinyl estradiol  (JUNEL 1/20) 1-20 MG-MCG tablet Take 1 tablet by mouth daily. 84 tablet 3   No current facility-administered medications on file prior to visit.    BP 104/80 (BP Location: Left Arm, Patient Position: Sitting, Cuff Size: Large)   Pulse 71   Temp 98.9 F (37.2 C) (Oral)   Ht 5' 6 (1.676 m)   Wt 243 lb (110.2 kg)   LMP 06/25/2024 (Exact Date)   SpO2 96%   Breastfeeding No   BMI 39.22 kg/m   Objective:      Physical Exam GENERAL: Alert, cooperative, well developed, no acute distress. HEAD: Normocephalic atraumatic. EYES: Extraocular movements intact bilaterally, pupils round, equal and reactive to light bilaterally, conjunctivae normal bilaterally.SABRA EXTREMITIES: No cyanosis or edema. NEUROLOGICAL: Oriented to person, place and time, no gait abnormalities, moves all extremities without gross motor or sensory deficit. MUSCULOSKELETAL: Limited range of motion on forward flexion and extension of the spine, full range of motion on lateral flexion. Mild tenderness over left lumbar paraspinal muscles. Negative straight leg raise test bilaterally.      Assessment & Plan:   Assessment & Plan Low back pain Acute low back pain from left lumbar paraspinal muscle strain, no red flag symptoms,  no radiculopathy symptoms, no point tenderness over the spine, low suspicion for bone pathology, will defer x-ray at this time.  Will treat conservatively as below.  If unimproved at reevaluation, would recommend imaging.   - Referred to physical therapy for 6-8 weeks, 1-2 sessions weekly. - Prescribed ibuprofen  800 mg every 8 hours as needed with food. - Continue nightly heating pad use. - Provided work letter to limit lifting to not heavier than 20 pounds for 6 weeks. - Advised to avoid  pain-aggravating movements. - Follow up in 6 weeks to assess improvement     Return in about 6 weeks (around 08/11/2024) for Low back pain .   Hans Rusher K Nesa Distel, MD  06/30/24     Contains text generated by [employer]bridge.

## 2024-08-11 ENCOUNTER — Ambulatory Visit: Admitting: Primary Care
